# Patient Record
Sex: Male | Born: 1975 | Race: White | Hispanic: No | Marital: Married | Smoking: Former smoker
Health system: Southern US, Community
[De-identification: ages and names within clinical notes are randomized; demographics above are authoritative.]

## PROBLEM LIST (undated history)

## (undated) DIAGNOSIS — M109 Gout, unspecified: Secondary | ICD-10-CM

## (undated) DIAGNOSIS — Z8739 Personal history of other diseases of the musculoskeletal system and connective tissue: Secondary | ICD-10-CM

## (undated) DIAGNOSIS — E669 Obesity, unspecified: Secondary | ICD-10-CM

## (undated) DIAGNOSIS — G4730 Sleep apnea, unspecified: Secondary | ICD-10-CM

## (undated) DIAGNOSIS — I10 Essential (primary) hypertension: Secondary | ICD-10-CM

## (undated) DIAGNOSIS — G4733 Obstructive sleep apnea (adult) (pediatric): Secondary | ICD-10-CM

## (undated) DIAGNOSIS — E559 Vitamin D deficiency, unspecified: Secondary | ICD-10-CM

## (undated) DIAGNOSIS — R5383 Other fatigue: Secondary | ICD-10-CM

## (undated) HISTORY — DX: Other fatigue: R53.83

## (undated) HISTORY — PX: APPENDECTOMY: SHX54

## (undated) HISTORY — DX: Essential (primary) hypertension: I10

## (undated) HISTORY — DX: Vitamin D deficiency, unspecified: E55.9

## (undated) HISTORY — DX: Personal history of other diseases of the musculoskeletal system and connective tissue: Z87.39

## (undated) HISTORY — DX: Gout, unspecified: M10.9

## (undated) HISTORY — DX: Obesity, unspecified: E66.9

## (undated) HISTORY — DX: Sleep apnea, unspecified: G47.30

## (undated) HISTORY — DX: Obstructive sleep apnea (adult) (pediatric): G47.33

---

## 2008-10-02 ENCOUNTER — Emergency Department: Payer: Self-pay | Admitting: Emergency Medicine

## 2012-11-11 ENCOUNTER — Ambulatory Visit: Payer: Self-pay | Admitting: Neurology

## 2014-06-04 ENCOUNTER — Emergency Department: Payer: Self-pay | Admitting: Emergency Medicine

## 2014-06-08 ENCOUNTER — Emergency Department: Payer: Self-pay | Admitting: Emergency Medicine

## 2014-06-08 ENCOUNTER — Other Ambulatory Visit: Payer: Self-pay | Admitting: Family Medicine

## 2014-10-01 ENCOUNTER — Telehealth: Payer: Self-pay | Admitting: Family Medicine

## 2014-10-01 NOTE — Telephone Encounter (Signed)
I have just printed off information from the CDC on hepatitis C (by Barnett Applebaum) If he has had a possible exposure, refer him to the health depatment immediately

## 2014-10-01 NOTE — Telephone Encounter (Signed)
Patient notified to pick up handout, he was not exposed. 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

## 2015-02-07 ENCOUNTER — Encounter: Payer: Self-pay | Admitting: Family Medicine

## 2015-02-07 ENCOUNTER — Ambulatory Visit (INDEPENDENT_AMBULATORY_CARE_PROVIDER_SITE_OTHER): Payer: Managed Care, Other (non HMO) | Admitting: Family Medicine

## 2015-02-07 VITALS — BP 135/93 | HR 62 | Temp 98.4°F | Ht 77.8 in | Wt 360.0 lb

## 2015-02-07 DIAGNOSIS — Z566 Other physical and mental strain related to work: Secondary | ICD-10-CM | POA: Diagnosis not present

## 2015-02-07 DIAGNOSIS — R030 Elevated blood-pressure reading, without diagnosis of hypertension: Secondary | ICD-10-CM

## 2015-02-07 DIAGNOSIS — M79672 Pain in left foot: Secondary | ICD-10-CM | POA: Diagnosis not present

## 2015-02-07 DIAGNOSIS — Z0000 Encounter for general adult medical examination without abnormal findings: Secondary | ICD-10-CM | POA: Diagnosis not present

## 2015-02-07 DIAGNOSIS — IMO0001 Reserved for inherently not codable concepts without codable children: Secondary | ICD-10-CM

## 2015-02-07 MED ORDER — SERTRALINE HCL 100 MG PO TABS: 100.0000 mg | ORAL_TABLET | Freq: Every day | ORAL | Status: DC

## 2015-02-07 MED ORDER — SERTRALINE HCL 50 MG PO TABS: 50.0000 mg | ORAL_TABLET | Freq: Every day | ORAL | Status: DC

## 2015-02-07 MED ORDER — COLCHICINE 0.6 MG PO TABS: ORAL_TABLET | ORAL | Status: DC

## 2015-02-07 NOTE — Progress Notes (Signed)
Patient ID: Darren Robinson, male   DOB: 04/12/1975, 39 y.o.   MRN: [id number]   Subjective:   Darren Robinson is a 39 y.o. male here for a complete physical exam  Interim issues since last visit: pain along the 5th MTP laterally; no foreign body; no gout attack; no blisters, no callus; he has not tried; no NSAIDs; he has put pads on it  He did eat something salty, gained weight; taking sertraline and doing well with that; seeing counselor; was drinking too much a month ago and his wife said something to him, so he's stopped that; he cites stress at his work which has gotten significantly better  Interested in a diet; knows he needs to lose 100 pounds  USPSTF grade A and B recommendations Alcohol: cut back Depression:  Depression screen PHQ 2/9 02/07/2015  Decreased Interest 1  Down, Depressed, Hopeless 3  PHQ - 2 Score 4  Altered sleeping 0  Tired, decreased energy 3  Change in appetite 3  Feeling bad or failure about yourself  0  Trouble concentrating 3  Moving slowly or fidgety/restless 0  Suicidal thoughts 0  PHQ-9 Score 13  Difficult doing work/chores Very difficult  stress  Dr. Yves Dill, monitors labs, clomid, testosterone; few months ago had labs done Hypertension: not quite controlled Obesity: above normal, comfort eater Tobacco use: dipping HIV, hep B, hep C: already tested STD testing and prevention (chl/gon/syphilis): declined Lipids: fasting since b'fast Glucose: fasting Colorectal cancer: no one early in fam Breast cancer: no fam hx, no lumps Lung cancer: n/a Osteoporosis: n/a AAA: n/a Aspirin: n/a Diet: typical fat and fried, tons of meat Exercise: no time, he tries, ran yesterday brief period, 50-50 Skin cancer: no tanning beds; went to health fair; did scan, dry nose  Past Medical History  Diagnosis Date  . Gout   . Hypertension   . Vitamin D deficiency disease   . OSA (obstructive sleep apnea)   . Sleep apnea   . Fatigue    Past Surgical History   Procedure Laterality Date  . Appendectomy     Family History  Problem Relation Age of Onset  . Alcohol abuse Mother   . Arthritis Mother   . Heart disease Mother   . Mental illness Mother   . Thyroid disease Mother   . Diabetes Father   . Heart attack Father   . Kidney disease Father     On Dialysis  . Heart disease Father   . Hyperlipidemia Father   . Thyroid disease Sister   . Hyperlipidemia Sister   . Hypertension Sister   . Lung disease Maternal Grandfather   . Cancer Neg Hx   . Stroke Neg Hx    Social History  Substance Use Topics  . Smoking status: Former Smoker -- 1.00 packs/day for 10 years    Types: Cigarettes    Quit date: 04/06/2004  . Smokeless tobacco: Current User    Types: Chew, Snuff  . Alcohol Use: Yes     Comment: Social   Review of Systems  Constitutional: Positive for unexpected weight change.  HENT: Negative for dental problem and hearing loss.   Eyes: Positive for visual disturbance (blurry vision, later in the day, fine when first waking up; on computer, no dry eyes; had Lasik done; no eyelid heaviness).  Respiratory: Negative for shortness of breath.   Cardiovascular: Negative for chest pain.  Gastrointestinal: Negative for blood in stool.  Genitourinary: Negative for hematuria, decreased urine volume, scrotal swelling, difficulty  urinating and testicular pain.  Skin:       No worrisome moles  Allergic/Immunologic: Negative for food allergies.   Objective:   Filed Vitals:   02/07/15 1358  BP: 135/93  Pulse: 62  Temp: 98.4 F (36.9 C)  Height: 6' 5.8" (1.976 m)  Weight: 360 lb (163.295 kg)  SpO2: 96%   Body mass index is 41.82 kg/(m^2). Wt Readings from Last 3 Encounters:  02/07/15 360 lb (163.295 kg)  07/23/14 358 lb (162.388 kg)   Physical Exam  Constitutional: He appears well-developed and well-nourished. No distress.  Morbidly obese  HENT:  Head: Normocephalic and atraumatic.  Nose: Nose normal.  Mouth/Throat: Oropharynx  is clear and moist.  Eyes: EOM are normal. No scleral icterus.  Neck: No JVD present. No thyromegaly present.  Cardiovascular: Normal rate, regular rhythm and normal heart sounds.   Pulmonary/Chest: Effort normal and breath sounds normal. No respiratory distress. He has no wheezes. He has no rales.  Abdominal: Soft. Bowel sounds are normal. He exhibits no distension. There is no tenderness. There is no guarding.  Musculoskeletal: Normal range of motion. He exhibits no edema.       Left foot: There is tenderness (along lateral aspect head of 5th MTP). There is no swelling and no deformity.  Calluses along base of left foot under ball of foot; no evidence of foreign body; foot is warm, well-perfused  Lymphadenopathy:    He has no cervical adenopathy.  Neurological: He is alert. He displays normal reflexes. He exhibits normal muscle tone. Coordination normal.  Skin: Skin is warm and dry. No rash noted. He is not diaphoretic. No erythema. No pallor.  Psychiatric: He has a normal mood and affect. His behavior is normal. Judgment and thought content normal. His mood appears not anxious. His affect is not angry. He is not agitated, not aggressive and not hyperactive. He does not exhibit a depressed mood.  Very good eye contact with examiner; neatly dressed, business attire    Assessment/Plan:   Problem List Items Addressed This Visit      Other   Preventative health care - Primary    USPSTF grade A and B recommendations reviewed with patient; age-appropriate recommendations, preventive care, screening tests, etc discussed and encouraged; healthy living encouraged; see AVS for patient education given to patient      Relevant Orders   CBC with Differential/Platelet (Completed)   Comprehensive metabolic panel (Completed)   Lipid Panel w/o Chol/HDL Ratio (Completed)   TSH (Completed)   Morbid obesity ([place])    Counseling given on healthier eating, reducing total daily calories, staying hydrated,  etc.; refer to nutritionist; see AVS for information; consider calorie count as a tool; return in 4 weeks      Relevant Orders   Amb ref to Medical Nutrition Therapy-MNT   Work-related stress    Continue counseling; glad he has realized he was using alcohol in excess and that he has stopped that now; will increase the sertraline and see him back for follow-up in 4 weeks      Elevated blood pressure    Explained pressure is above target; weight loss important; DASH guidelines recommended; stress reduction; return in 4 weeks for recheck      Foot pain, left    Not typical for gout; may be stress fracture with his morbid obesity and size; will have him try conservative measures first; consider xray if not improving         Orders Placed This Encounter  Procedures  . CBC with Differential/Platelet  . Comprehensive metabolic panel    Order Specific Question:  Has the patient fasted?    Answer:  Yes  . Lipid Panel w/o Chol/HDL Ratio    Order Specific Question:  Has the patient fasted?    Answer:  Yes  . TSH  . Amb ref to Medical Nutrition Therapy-MNT    Referral Priority:  Routine    Referral Type:  Consultation    Referral Reason:  Specialty Services Required    Requested Specialty:  Nutrition    Number of Visits Requested:  1    Follow up plan: Return in about 4 weeks (around 03/07/2015) for blood pressure and medicine change; 1 year for next physical.  An after-visit summary was printed and given to the patient at [facility].  Please see the patient instructions which may contain other information and recommendations beyond what is mentioned above in the assessment and plan.  Refer nutrition: any day any time  Meds ordered this encounter  Medications  . DISCONTD: sertraline (ZOLOFT) 50 MG tablet    Sig: Take 1 tablet (50 mg total) by mouth daily.    Dispense:  90 tablet    Refill:  3  . colchicine 0.6 MG tablet    Sig: Two pills by mouth at onset of gout flare and then  one pill one hour later    Dispense:  3 tablet    Refill:  6  . sertraline (ZOLOFT) 100 MG tablet    Sig: Take 1 tablet (100 mg total) by mouth daily.    Dispense:  30 tablet    Refill:  3    Please ignore the other 50 mg strength, we're upping it

## 2015-02-07 NOTE — Patient Instructions (Addendum)
Topical ice 20 minutes at a time 3-4 times a day, thin cloth between ice and skin Try turmeric as a natural anti-inflammatory (for pain and arthritis). It comes in capsules where you buy aspirin and fish oil, but also as a spice where you buy pepper and garlic powder. If you need something for aches or pains, try to use Tylenol (acetaminphen) instead of non-steroidals (which include Aleve, ibuprofen, Advil, Motrin, and naproxen); non-steroidals can cause long-term kidney damage  Consider xray in the future if not getting better  Consider getting computer screen glasses (+0.75 strength) and see an eye doctor  Check out the information at familydoctor.org entitled "What It Takes to Lose Weight" Try to lose between 1-2 pounds per week by taking in fewer calories and burning off more calories You can succeed by limiting portions, limiting foods dense in calories and fat, becoming more active, and drinking 8 glasses of water a day (64 ounces) Don't skip meals, especially breakfast, as skipping meals may alter your metabolism Do not use over-the-counter weight loss pills or gimmicks that claim rapid weight loss A healthy BMI (or body mass index) is between 18.5 and 24.9 You can calculate your ideal BMI at the [place] website ClubMonetize.fr  myfitnesspal  I do recommend yearly flu shots; for individuals who don't want flu shots, try to practice excellent hand hygiene, and avoid nursing homes, day cares, and hospitals during peak flu season; taking vitamin C daily during flu/cold season may help boost your immune system too  Let's have you come back to see me in 4 weeks for recheck blood pressure and recheck after the change in the sertraline  Try to follow the DASH guidelines to help lower your blood pressure naturally  You might also try meditation and mindfulness, relaxation techniques to help deal with stress Steps to Elicit the Relaxation  Response The following is the technique reprinted with permission from Dr. Billie Ruddy book The Relaxation Response pages 162-163 1. Sit quietly in a comfortable position. 2. Close your eyes. 3. Deeply relax all your muscles,  beginning at your feet and progressing up to your face.  Keep them relaxed. 4. Breathe through your nose.  Become aware of your breathing.  As you breathe out, say the word, "one"*,  silently to yourself. For example,  breathe in ... out, "one",- in .. out, "one", etc.  Breathe easily and naturally. 5. Continue for 10 to 20 minutes.  You may open your eyes to check the time, but do not use an alarm.  When you finish, sit quietly for several minutes,  at first with your eyes closed and later with your eyes opened.  Do not stand up for a few minutes. 6. Do not worry about whether you are successful  in achieving a deep level of relaxation.  Maintain a passive attitude and permit relaxation to occur at its own pace.  When distracting thoughts occur,  try to ignore them by not dwelling upon them  and return to repeating "one."  With practice, the response should come with little effort.  Practice the technique once or twice daily,  but not within two hours after any meal,  since the digestive processes seem to interfere with  the elicitation of the Relaxation Response. * It is better to use a soothing, mellifluous sound, preferably with no meaning. or association, to avoid stimulation of unnecessary thoughts - a mantra.    Health Maintenance, Male A healthy lifestyle and preventative care can promote health and  wellness.  Maintain regular health, dental, and eye exams.  Eat a healthy diet. Foods like vegetables, fruits, whole grains, low-fat dairy products, and lean protein foods contain the nutrients you need and are low in calories. Decrease your intake of foods high in solid fats, added sugars, and salt. Get information about a proper diet from your  health care provider, if necessary.  Regular physical exercise is one of the most important things you can do for your health. Most adults should get at least 150 minutes of moderate-intensity exercise (any activity that increases your heart rate and causes you to sweat) each week. In addition, most adults need muscle-strengthening exercises on 2 or more days a week.   Maintain a healthy weight. The body mass index (BMI) is a screening tool to identify possible weight problems. It provides an estimate of body fat based on height and weight. Your health care provider can find your BMI and can help you achieve or maintain a healthy weight. For males 20 years and older:  A BMI below 18.5 is considered underweight.  A BMI of 18.5 to 24.9 is normal.  A BMI of 25 to 29.9 is considered overweight.  A BMI of 30 and above is considered obese.  Maintain normal blood lipids and cholesterol by exercising and minimizing your intake of saturated fat. Eat a balanced diet with plenty of fruits and vegetables. Blood tests for lipids and cholesterol should begin at age 43 and be repeated every 5 years. If your lipid or cholesterol levels are high, you are over age 87, or you are at high risk for heart disease, you may need your cholesterol levels checked more frequently.Ongoing high lipid and cholesterol levels should be treated with medicines if diet and exercise are not working.  If you smoke, find out from your health care provider how to quit. If you do not use tobacco, do not start.  Lung cancer screening is recommended for adults aged 9-80 years who are at high risk for developing lung cancer because of a history of smoking. A yearly low-dose CT scan of the lungs is recommended for people who have at least a 30-pack-year history of smoking and are current smokers or have quit within the past 15 years. A pack year of smoking is smoking an average of 1 pack of cigarettes a day for 1 year (for example, a  30-pack-year history of smoking could mean smoking 1 pack a day for 30 years or 2 packs a day for 15 years). Yearly screening should continue until the smoker has stopped smoking for at least 15 years. Yearly screening should be stopped for people who develop a health problem that would prevent them from having lung cancer treatment.  If you choose to drink alcohol, do not have more than 2 drinks per day. One drink is considered to be 12 oz (360 mL) of beer, 5 oz (150 mL) of wine, or 1.5 oz (45 mL) of liquor.  Avoid the use of street drugs. Do not share needles with anyone. Ask for help if you need support or instructions about stopping the use of drugs.  High blood pressure causes heart disease and increases the risk of stroke. High blood pressure is more likely to develop in:  People who have blood pressure in the end of the normal range (100-139/85-89 mm Hg).  People who are overweight or obese.  People who are African American.  If you are 61-62 years of age, have your blood pressure checked  every 3-5 years. If you are 56 years of age or older, have your blood pressure checked every year. You should have your blood pressure measured twice--once when you are at a hospital or clinic, and once when you are not at a hospital or clinic. Record the average of the two measurements. To check your blood pressure when you are not at a hospital or clinic, you can use:  An automated blood pressure machine at a pharmacy.  A home blood pressure monitor.  If you are 64-10 years old, ask your health care provider if you should take aspirin to prevent heart disease.  Diabetes screening involves taking a blood sample to check your fasting blood sugar level. This should be done once every 3 years after age 36 if you are at a normal weight and without risk factors for diabetes. Testing should be considered at a younger age or be carried out more frequently if you are overweight and have at least 1 risk factor  for diabetes.  Colorectal cancer can be detected and often prevented. Most routine colorectal cancer screening begins at the age of 70 and continues through age 6. However, your health care provider may recommend screening at an earlier age if you have risk factors for colon cancer. On a yearly basis, your health care provider may provide home test kits to check for hidden blood in the stool. A small camera at the end of a tube may be used to directly examine the colon (sigmoidoscopy or colonoscopy) to detect the earliest forms of colorectal cancer. Talk to your health care provider about this at age 57 when routine screening begins. A direct exam of the colon should be repeated every 5-10 years through age 71, unless early forms of precancerous polyps or small growths are found.  People who are at an increased risk for hepatitis B should be screened for this virus. You are considered at high risk for hepatitis B if:  You were born in a country where hepatitis B occurs often. Talk with your health care provider about which countries are considered high risk.  Your parents were born in a high-risk country and you have not received a shot to protect against hepatitis B (hepatitis B vaccine).  You have HIV or AIDS.  You use needles to inject street drugs.  You live with, or have sex with, someone who has hepatitis B.  You are a man who has sex with other men (MSM).  You get hemodialysis treatment.  You take certain medicines for conditions like cancer, organ transplantation, and autoimmune conditions.  Hepatitis C blood testing is recommended for all people born from [birth year] through 1964 and any individual with known risk factors for hepatitis C.  Healthy men should no longer receive prostate-specific antigen (PSA) blood tests as part of routine cancer screening. Talk to your health care provider about prostate cancer screening.  Testicular cancer screening is not recommended for adolescents or  adult males who have no symptoms. Screening includes self-exam, a health care provider exam, and other screening tests. Consult with your health care provider about any symptoms you have or any concerns you have about testicular cancer.  Practice safe sex. Use condoms and avoid high-risk sexual practices to reduce the spread of sexually transmitted infections (STIs).  You should be screened for STIs, including gonorrhea and chlamydia if:  You are sexually active and are younger than 24 years.  You are older than 24 years, and your health care provider tells you  that you are at risk for this type of infection.  Your sexual activity has changed since you were last screened, and you are at an increased risk for chlamydia or gonorrhea. Ask your health care provider if you are at risk.  If you are at risk of being infected with HIV, it is recommended that you take a prescription medicine daily to prevent HIV infection. This is called pre-exposure prophylaxis (PrEP). You are considered at risk if:  You are a man who has sex with other men (MSM).  You are a heterosexual man who is sexually active with multiple partners.  You take drugs by injection.  You are sexually active with a partner who has HIV.  Talk with your health care provider about whether you are at high risk of being infected with HIV. If you choose to begin PrEP, you should first be tested for HIV. You should then be tested every 3 months for as long as you are taking PrEP.  Use sunscreen. Apply sunscreen liberally and repeatedly throughout the day. You should seek shade when your shadow is shorter than you. Protect yourself by wearing long sleeves, pants, a wide-brimmed hat, and sunglasses year round whenever you are outdoors.  Tell your health care provider of new moles or changes in moles, especially if there is a change in shape or color. Also, tell your health care provider if a mole is larger than the size of a pencil  eraser.  A one-time screening for abdominal aortic aneurysm (AAA) and surgical repair of large AAAs by ultrasound is recommended for men aged 51-75 years who are current or former smokers.  Stay current with your vaccines (immunizations).   This information is not intended to replace advice given to you by your health care provider. Make sure you discuss any questions you have with your health care provider.   Document Released: 09/19/2007 Document Revised: 04/13/2014 Document Reviewed: 08/18/2010 Elsevier Interactive Patient Education 2016 [place] DASH stands for "Dietary Approaches to Stop Hypertension." The DASH eating plan is a healthy eating plan that has been shown to reduce high blood pressure (hypertension). Additional health benefits may include reducing the risk of type 2 diabetes mellitus, heart disease, and stroke. The DASH eating plan may also help with weight loss. WHAT DO I NEED TO KNOW ABOUT THE DASH EATING PLAN? For the DASH eating plan, you will follow these general guidelines:  Choose foods with a percent daily value for sodium of less than 5% (as listed on the food label).  Use salt-free seasonings or herbs instead of table salt or sea salt.  Check with your health care provider or pharmacist before using salt substitutes.  Eat lower-sodium products, often labeled as "lower sodium" or "no salt added."  Eat fresh foods.  Eat more vegetables, fruits, and low-fat dairy products.  Choose whole grains. Look for the word "whole" as the first word in the ingredient list.  Choose fish and skinless chicken or [place] more often than red meat. Limit fish, poultry, and meat to 6 oz (170 g) each day.  Limit sweets, desserts, sugars, and sugary drinks.  Choose heart-healthy fats.  Limit cheese to 1 oz (28 g) per day.  Eat more home-cooked food and less restaurant, buffet, and fast food.  Limit fried foods.  Cook foods using methods other than  frying.  Limit canned vegetables. If you do use them, rinse them well to decrease the sodium.  When eating at a restaurant,  ask that your food be prepared with less salt, or no salt if possible. WHAT FOODS CAN I EAT? Seek help from a dietitian for individual calorie needs. Grains Whole grain or whole wheat bread. Brown rice. Whole grain or whole wheat pasta. Quinoa, bulgur, and whole grain cereals. Low-sodium cereals. Corn or whole wheat flour tortillas. Whole grain cornbread. Whole grain crackers. Low-sodium crackers. Vegetables Fresh or frozen vegetables (raw, steamed, roasted, or grilled). Low-sodium or reduced-sodium tomato and vegetable juices. Low-sodium or reduced-sodium tomato sauce and paste. Low-sodium or reduced-sodium canned vegetables.  Fruits All fresh, canned (in natural juice), or frozen fruits. Meat and Other Protein Products Ground beef (85% or leaner), grass-fed beef, or beef trimmed of fat. Skinless chicken or [place]. Ground chicken or [place]. Pork trimmed of fat. All fish and seafood. Eggs. Dried beans, peas, or lentils. Unsalted nuts and seeds. Unsalted canned beans. Dairy Low-fat dairy products, such as skim or 1% milk, 2% or reduced-fat cheeses, low-fat ricotta or cottage cheese, or plain low-fat yogurt. Low-sodium or reduced-sodium cheeses. Fats and Oils Tub margarines without trans fats. Light or reduced-fat mayonnaise and salad dressings (reduced sodium). Avocado. Safflower, olive, or canola oils. Natural peanut or almond butter. Other Unsalted popcorn and pretzels. The items listed above may not be a complete list of recommended foods or beverages. Contact your dietitian for more options. WHAT FOODS ARE NOT RECOMMENDED? Grains White bread. White pasta. White rice. Refined cornbread. Bagels and croissants. Crackers that contain trans fat. Vegetables Creamed or fried vegetables. Vegetables in a cheese sauce. Regular canned vegetables. Regular canned tomato sauce  and paste. Regular tomato and vegetable juices. Fruits Dried fruits. Canned fruit in light or heavy syrup. Fruit juice. Meat and Other Protein Products Fatty cuts of meat. Ribs, chicken wings, bacon, sausage, bologna, salami, chitterlings, fatback, hot dogs, bratwurst, and packaged luncheon meats. Salted nuts and seeds. Canned beans with salt. Dairy Whole or 2% milk, cream, half-and-half, and cream cheese. Whole-fat or sweetened yogurt. Full-fat cheeses or blue cheese. Nondairy creamers and whipped toppings. Processed cheese, cheese spreads, or cheese curds. Condiments Onion and garlic salt, seasoned salt, table salt, and sea salt. Canned and packaged gravies. Worcestershire sauce. Tartar sauce. Barbecue sauce. Teriyaki sauce. Soy sauce, including reduced sodium. Steak sauce. Fish sauce. Oyster sauce. Cocktail sauce. Horseradish. Ketchup and mustard. Meat flavorings and tenderizers. Bouillon cubes. Hot sauce. Tabasco sauce. Marinades. Taco seasonings. Relishes. Fats and Oils Butter, stick margarine, lard, shortening, ghee, and bacon fat. Coconut, palm kernel, or palm oils. Regular salad dressings. Other Pickles and olives. Salted popcorn and pretzels. The items listed above may not be a complete list of foods and beverages to avoid. Contact your dietitian for more information. WHERE CAN I FIND MORE INFORMATION? National Heart, Lung, and Blood Institute: travelstabloid.com   This information is not intended to replace advice given to you by your health care provider. Make sure you discuss any questions you have with your health care provider.   Document Released: 03/12/2011 Document Revised: 04/13/2014 Document Reviewed: 01/25/2013 Elsevier Interactive Patient Education [employer].

## 2015-02-08 LAB — COMPREHENSIVE METABOLIC PANEL
ALBUMIN: 4.6 g/dL (ref 3.5–5.5)
ALT: 30 IU/L (ref 0–44)
AST: 25 IU/L (ref 0–40)
Albumin/Globulin Ratio: 1.9 (ref 1.1–2.5)
Alkaline Phosphatase: 54 IU/L (ref 39–117)
BILIRUBIN TOTAL: 0.4 mg/dL (ref 0.0–1.2)
BUN / CREAT RATIO: 9 (ref 8–19)
BUN: 12 mg/dL (ref 6–20)
CHLORIDE: 105 mmol/L (ref 97–106)
CO2: 24 mmol/L (ref 18–29)
Calcium: 9.3 mg/dL (ref 8.7–10.2)
Creatinine, Ser: 1.34 mg/dL — ABNORMAL HIGH (ref 0.76–1.27)
GFR calc non Af Amer: 67 mL/min/{1.73_m2} (ref >59)
GFR, EST AFRICAN AMERICAN: 77 mL/min/{1.73_m2} (ref >59)
Globulin, Total: 2.4 g/dL (ref 1.5–4.5)
Glucose: 83 mg/dL (ref 65–99)
POTASSIUM: 4.6 mmol/L (ref 3.5–5.2)
Sodium: 143 mmol/L (ref 136–144)
TOTAL PROTEIN: 7 g/dL (ref 6.0–8.5)

## 2015-02-08 LAB — CBC WITH DIFFERENTIAL/PLATELET
BASOS: 1 %
Basophils Absolute: 0.1 10*3/uL (ref 0.0–0.2)
EOS (ABSOLUTE): 0.3 10*3/uL (ref 0.0–0.4)
Eos: 4 %
HEMOGLOBIN: 15.5 g/dL (ref 12.6–17.7)
Hematocrit: 44.7 % (ref 37.5–51.0)
IMMATURE GRANS (ABS): 0 10*3/uL (ref 0.0–0.1)
Immature Granulocytes: 0 %
LYMPHS ABS: 2.9 10*3/uL (ref 0.7–3.1)
LYMPHS: 38 %
MCH: 31.1 pg (ref 26.6–33.0)
MCHC: 34.7 g/dL (ref 31.5–35.7)
MCV: 90 fL (ref 79–97)
Monocytes Absolute: 0.7 10*3/uL (ref 0.1–0.9)
Monocytes: 8 %
NEUTROS ABS: 3.8 10*3/uL (ref 1.4–7.0)
Neutrophils: 49 %
PLATELETS: 171 10*3/uL (ref 150–379)
RBC: 4.99 x10E6/uL (ref 4.14–5.80)
RDW: 14.1 % (ref 12.3–15.4)
WBC: 7.7 10*3/uL (ref 3.4–10.8)

## 2015-02-08 LAB — TSH: TSH: 3 u[IU]/mL (ref 0.450–4.500)

## 2015-02-08 LAB — LIPID PANEL W/O CHOL/HDL RATIO
Cholesterol, Total: 210 mg/dL — ABNORMAL HIGH (ref 100–199)
HDL: 42 mg/dL (ref >39)
LDL Calculated: 125 mg/dL — ABNORMAL HIGH (ref 0–99)
Triglycerides: 216 mg/dL — ABNORMAL HIGH (ref 0–149)
VLDL Cholesterol Cal: 43 mg/dL — ABNORMAL HIGH (ref 5–40)

## 2015-02-10 ENCOUNTER — Encounter: Payer: Self-pay | Admitting: Family Medicine

## 2015-02-11 DIAGNOSIS — Z566 Other physical and mental strain related to work: Secondary | ICD-10-CM | POA: Insufficient documentation

## 2015-02-11 DIAGNOSIS — I10 Essential (primary) hypertension: Secondary | ICD-10-CM

## 2015-02-11 DIAGNOSIS — M79672 Pain in left foot: Secondary | ICD-10-CM | POA: Insufficient documentation

## 2015-02-11 DIAGNOSIS — E669 Obesity, unspecified: Secondary | ICD-10-CM

## 2015-02-11 HISTORY — DX: Essential (primary) hypertension: I10

## 2015-02-11 HISTORY — DX: Obesity, unspecified: E66.9

## 2015-02-11 NOTE — Assessment & Plan Note (Signed)
Explained pressure is above target; weight loss important; DASH guidelines recommended; stress reduction; return in 4 weeks for recheck

## 2015-02-11 NOTE — Assessment & Plan Note (Signed)
Continue counseling; glad he has realized he was using alcohol in excess and that he has stopped that now; will increase the sertraline and see him back for follow-up in 4 weeks

## 2015-02-11 NOTE — Assessment & Plan Note (Signed)
USPSTF grade A and B recommendations reviewed with patient; age-appropriate recommendations, preventive care, screening tests, etc discussed and encouraged; healthy living encouraged; see AVS for patient education given to patient  

## 2015-02-11 NOTE — Assessment & Plan Note (Signed)
Counseling given on healthier eating, reducing total daily calories, staying hydrated, etc.; refer to nutritionist; see AVS for information; consider calorie count as a tool; return in 4 weeks

## 2015-02-11 NOTE — Assessment & Plan Note (Signed)
Not typical for gout; may be stress fracture with his morbid obesity and size; will have him try conservative measures first; consider xray if not improving

## 2015-02-21 ENCOUNTER — Encounter: Payer: Managed Care, Other (non HMO) | Attending: Family Medicine | Admitting: Dietician

## 2015-02-21 ENCOUNTER — Encounter: Payer: Self-pay | Admitting: Dietician

## 2015-02-21 DIAGNOSIS — E669 Obesity, unspecified: Secondary | ICD-10-CM | POA: Insufficient documentation

## 2015-02-21 NOTE — Progress Notes (Signed)
Medical Nutrition Therapy: Visit start time: 0900  end time: 1000  Assessment:  Diagnosis: obesity Past medical history: gout, GERD, sleep apnea Psychosocial issues/ stress concerns: depression, high stress level due to work Preferred learning method:  . Hands-on   Current weight: 369.5lbs with shoes, suit  Height: 6'7" Medications, supplements: reviewed list in chart with patient Progress and evaluation: Patient reports busy schedule at work and sometimes long hours at work, to the point of not having time to eat much during the day.           He states he is very hungry by the time he gets home from work, and then eats large meal with sweets afterwards.          He reports some stress eating as well.          He has tried some weight loss plans which lasted only a few days.            Physical activity: some on the job activity, no structured exercise.  Dietary Intake:  Usual eating pattern includes 1-3 meals and 1 snacks per day. Dining out frequency: 9 meals per week.  Breakfast: sausage 2, 2 eggs, grits; occasionally pancakes; sometimes misses due to time.  Snack: none Lunch: fast food, occasionally leftovers, often no lunch due to work.  Snack: none Supper: wife cooks: spaghetti, chicken pie, pulled pork mac and cheese.  Snack: cookies, ice cream Beverages: water at work; mt dew, pepsi, beer  Nutrition Care Education: Topics covered: weight management Basic nutrition: basic food groups, appropriate nutrient balance, appropriate meal and snack schedule, general nutrition guidelines    Weight control: 2200kcal meal plan with 50%CHO, 20% protein, 30% fat; importance of eating at regular intervals, options for quick meals and snacks,   Importance of low sugar and low fat foods and beverages, behavioral changes for weight loss such as eating slowly and managing stress prior to eating.  Advanced nutrition: dining out Other lifestyle changes: identifying habits that need to change,  stress management  Nutritional Diagnosis:  [place]-3.3 Overweight/obesity As related to excess caloric intake, stress, low activity.  As evidenced by patient report, high BMI.  Intervention: Instruction as noted above.   Did not address exercise and food tracking at this time due to patient's busy schedule.    Set goals with patient input.    Patient declined scheduling follow-up at this time; will plan to call patient to check on progress in 1 month.      Education Materials given:  . Food lists/ Planning A Balanced Meal . Sample meal pattern/ menus; Quick and Healthy Meal Ideas . Snacking handout . Goals/ instructions  Learner/ who was taught:  . Patient  . Spouse/ partner   Level of understanding: Marland Kitchen Verbalizes/ demonstrates competency  Demonstrated degree of understanding via:   Teach back Learning barriers: . None   Willingness to learn/ readiness for change: . Acceptance, ready for change   Monitoring and Evaluation:  Dietary intake, exercise, and body weight      follow up: by phone in about 1 month

## 2015-02-21 NOTE — Patient Instructions (Signed)
   Try having a protein or nutrition drink such as Premier Protein, muscle milk, Glucerna during the day if unable to eat lunch.   Carry simple sandwich or snacks with you to eat during the day.   Choose grilled or baked meats at restaurants and include a fruit or vegetable when able.   Eat slowly in the evenings, chew food thoroughly, and work to decrease food portions especially of starchy foods.   Keep regular sodas to 1 glass daily or less.

## 2015-04-22 ENCOUNTER — Telehealth: Payer: Self-pay | Admitting: Dietician

## 2015-04-22 NOTE — Telephone Encounter (Signed)
Called patient to check on progress. He reports being unable to implement changes to improve nutrition during the day, his work prevents him from being able to eat much.  He feels he has not lost weight.  Encouraged him to continue working on portion control, lowfat choices, and sugar-free beverages.  Encouraged him to schedule follow-up if he would like to regroup and continue working on weight loss.

## 2015-07-26 ENCOUNTER — Other Ambulatory Visit: Payer: Self-pay | Admitting: Family Medicine

## 2015-07-26 DIAGNOSIS — R7989 Other specified abnormal findings of blood chemistry: Secondary | ICD-10-CM

## 2015-07-26 NOTE — Telephone Encounter (Signed)
Pt is going to stay with you and has scheduled an appt for may 1, can he go ahead and get refill?

## 2015-07-26 NOTE — Telephone Encounter (Signed)
Yes, absolutely; Rx sent; we look forward to seeing him

## 2015-07-26 NOTE — Telephone Encounter (Signed)
Patient does not have any upcoming appointments We had hoped to see him 4 weeks after last visit for BP and weight; I believe I was out sick, but I don't see that his visit was rescheduled Please find out if he's staying at [facility] or moving to [place], schedule f/u appt, and back to me if I'm seeing him, or forward request to [facility] if they're seeing him Thank you

## 2015-08-05 ENCOUNTER — Ambulatory Visit (INDEPENDENT_AMBULATORY_CARE_PROVIDER_SITE_OTHER): Payer: Managed Care, Other (non HMO) | Admitting: Family Medicine

## 2015-08-05 ENCOUNTER — Encounter: Payer: Self-pay | Admitting: Family Medicine

## 2015-08-05 VITALS — BP 134/90 | HR 81 | Temp 98.0°F | Resp 16 | Ht 79.0 in | Wt 350.0 lb

## 2015-08-05 DIAGNOSIS — E669 Obesity, unspecified: Secondary | ICD-10-CM | POA: Diagnosis not present

## 2015-08-05 DIAGNOSIS — Z566 Other physical and mental strain related to work: Secondary | ICD-10-CM | POA: Diagnosis not present

## 2015-08-05 DIAGNOSIS — Z8639 Personal history of other endocrine, nutritional and metabolic disease: Secondary | ICD-10-CM | POA: Diagnosis not present

## 2015-08-05 DIAGNOSIS — R7989 Other specified abnormal findings of blood chemistry: Secondary | ICD-10-CM

## 2015-08-05 DIAGNOSIS — R748 Abnormal levels of other serum enzymes: Secondary | ICD-10-CM | POA: Diagnosis not present

## 2015-08-05 DIAGNOSIS — E785 Hyperlipidemia, unspecified: Secondary | ICD-10-CM | POA: Insufficient documentation

## 2015-08-05 DIAGNOSIS — I10 Essential (primary) hypertension: Secondary | ICD-10-CM

## 2015-08-05 DIAGNOSIS — Z8739 Personal history of other diseases of the musculoskeletal system and connective tissue: Secondary | ICD-10-CM

## 2015-08-05 HISTORY — DX: Personal history of other diseases of the musculoskeletal system and connective tissue: Z87.39

## 2015-08-05 MED ORDER — AMLODIPINE BESYLATE 5 MG PO TABS: 5.0000 mg | ORAL_TABLET | Freq: Every day | ORAL | Status: DC

## 2015-08-05 NOTE — Assessment & Plan Note (Signed)
Reviewed lipid panel from Nov 2016; recheck next week fasting

## 2015-08-05 NOTE — Patient Instructions (Addendum)
Start the new blood pressure medicine  Your goal blood pressure is less than 140 mmHg on top, and less than 90 on bottom Try to follow the DASH guidelines (DASH stands for Dietary Approaches to Stop Hypertension) Try to limit the sodium in your diet.  Ideally, consume less than 1.5 grams (less than 1,500mg ) per day. Do not add salt when cooking or at the table.  Check the sodium amount on labels when shopping, and choose items lower in sodium when given a choice. Avoid or limit foods that already contain a lot of sodium. Eat a diet rich in fruits and vegetables and whole grains.   Steps to Elicit the Relaxation Response The following is the technique reprinted with permission from Dr. Billie Ruddy book The Relaxation Response pages 162-163 1. Sit quietly in a comfortable position. 2. Close your eyes. 3. Deeply relax all your muscles,  beginning at your feet and progressing up to your face.  Keep them relaxed. 4. Breathe through your nose.  Become aware of your breathing.  As you breathe out, say the word, "one"*,  silently to yourself. For example,  breathe in ... out, "one",- in .. out, "one", etc.  Breathe easily and naturally. 5. Continue for 10 to 20 minutes.  You may open your eyes to check the time, but do not use an alarm.  When you finish, sit quietly for several minutes,  at first with your eyes closed and later with your eyes opened.  Do not stand up for a few minutes. 6. Do not worry about whether you are successful  in achieving a deep level of relaxation.  Maintain a passive attitude and permit relaxation to occur at its own pace.  When distracting thoughts occur,  try to ignore them by not dwelling upon them  and return to repeating "one."  With practice, the response should come with little effort.  Practice the technique once or twice daily,  but not within two hours after any meal,  since the digestive processes seem to interfere with  the elicitation of the  Relaxation Response. * It is better to use a soothing, mellifluous sound, preferably with no meaning. or association, to avoid stimulation of unnecessary thoughts - a mantra. DASH Eating Plan DASH stands for "Dietary Approaches to Stop Hypertension." The DASH eating plan is a healthy eating plan that has been shown to reduce high blood pressure (hypertension). Additional health benefits may include reducing the risk of type 2 diabetes mellitus, heart disease, and stroke. The DASH eating plan may also help with weight loss. WHAT DO I NEED TO KNOW ABOUT THE DASH EATING PLAN? For the DASH eating plan, you will follow these general guidelines:  Choose foods with a percent daily value for sodium of less than 5% (as listed on the food label).  Use salt-free seasonings or herbs instead of table salt or sea salt.  Check with your health care provider or pharmacist before using salt substitutes.  Eat lower-sodium products, often labeled as "lower sodium" or "no salt added."  Eat fresh foods.  Eat more vegetables, fruits, and low-fat dairy products.  Choose whole grains. Look for the word "whole" as the first word in the ingredient list.  Choose fish and skinless chicken or [place] more often than red meat. Limit fish, poultry, and meat to 6 oz (170 g) each day.  Limit sweets, desserts, sugars, and sugary drinks.  Choose heart-healthy fats.  Limit cheese to 1 oz (28 g) per day.  Eat more home-cooked food and less restaurant, buffet, and fast food.  Limit fried foods.  Cook foods using methods other than frying.  Limit canned vegetables. If you do use them, rinse them well to decrease the sodium.  When eating at a restaurant, ask that your food be prepared with less salt, or no salt if possible. WHAT FOODS CAN I EAT? Seek help from a dietitian for individual calorie needs. Grains Whole grain or whole wheat bread. Brown rice. Whole grain or whole wheat pasta. Quinoa, bulgur, and whole  grain cereals. Low-sodium cereals. Corn or whole wheat flour tortillas. Whole grain cornbread. Whole grain crackers. Low-sodium crackers. Vegetables Fresh or frozen vegetables (raw, steamed, roasted, or grilled). Low-sodium or reduced-sodium tomato and vegetable juices. Low-sodium or reduced-sodium tomato sauce and paste. Low-sodium or reduced-sodium canned vegetables.  Fruits All fresh, canned (in natural juice), or frozen fruits. Meat and Other Protein Products Ground beef (85% or leaner), grass-fed beef, or beef trimmed of fat. Skinless chicken or [place]. Ground chicken or [place]. Pork trimmed of fat. All fish and seafood. Eggs. Dried beans, peas, or lentils. Unsalted nuts and seeds. Unsalted canned beans. Dairy Low-fat dairy products, such as skim or 1% milk, 2% or reduced-fat cheeses, low-fat ricotta or cottage cheese, or plain low-fat yogurt. Low-sodium or reduced-sodium cheeses. Fats and Oils Tub margarines without trans fats. Light or reduced-fat mayonnaise and salad dressings (reduced sodium). Avocado. Safflower, olive, or canola oils. Natural peanut or almond butter. Other Unsalted popcorn and pretzels. The items listed above may not be a complete list of recommended foods or beverages. Contact your dietitian for more options. WHAT FOODS ARE NOT RECOMMENDED? Grains White bread. White pasta. White rice. Refined cornbread. Bagels and croissants. Crackers that contain trans fat. Vegetables Creamed or fried vegetables. Vegetables in a cheese sauce. Regular canned vegetables. Regular canned tomato sauce and paste. Regular tomato and vegetable juices. Fruits Dried fruits. Canned fruit in light or heavy syrup. Fruit juice. Meat and Other Protein Products Fatty cuts of meat. Ribs, chicken wings, bacon, sausage, bologna, salami, chitterlings, fatback, hot dogs, bratwurst, and packaged luncheon meats. Salted nuts and seeds. Canned beans with salt. Dairy Whole or 2% milk, cream, half-and-half,  and cream cheese. Whole-fat or sweetened yogurt. Full-fat cheeses or blue cheese. Nondairy creamers and whipped toppings. Processed cheese, cheese spreads, or cheese curds. Condiments Onion and garlic salt, seasoned salt, table salt, and sea salt. Canned and packaged gravies. Worcestershire sauce. Tartar sauce. Barbecue sauce. Teriyaki sauce. Soy sauce, including reduced sodium. Steak sauce. Fish sauce. Oyster sauce. Cocktail sauce. Horseradish. Ketchup and mustard. Meat flavorings and tenderizers. Bouillon cubes. Hot sauce. Tabasco sauce. Marinades. Taco seasonings. Relishes. Fats and Oils Butter, stick margarine, lard, shortening, ghee, and bacon fat. Coconut, palm kernel, or palm oils. Regular salad dressings. Other Pickles and olives. Salted popcorn and pretzels. The items listed above may not be a complete list of foods and beverages to avoid. Contact your dietitian for more information. WHERE CAN I FIND MORE INFORMATION? National Heart, Lung, and Blood Institute: travelstabloid.com   This information is not intended to replace advice given to you by your health care provider. Make sure you discuss any questions you have with your health care provider.   Document Released: 03/12/2011 Document Revised: 04/13/2014 Document Reviewed: 01/25/2013 Elsevier Interactive Patient Education [employer].

## 2015-08-05 NOTE — Assessment & Plan Note (Signed)
Suspect this is related to his muscle mass, large build, more than a marker of actual renal function decline; recheck next week

## 2015-08-05 NOTE — Assessment & Plan Note (Signed)
Glad to hear he is not drinking, no recent flares; will avoid HCTZ for BP; check uric acid with other labs next week

## 2015-08-05 NOTE — Assessment & Plan Note (Addendum)
advised him to STOP the phentermine with his hypertension, risk of stroke, heart attack

## 2015-08-05 NOTE — Assessment & Plan Note (Signed)
Encouraged him to start back with counseling; reviewed relaxation response with him; continue SSRI

## 2015-08-05 NOTE — Assessment & Plan Note (Signed)
Advised him to STOP the phentermine; start antihypertensive; try to follow DASH guidelines; close f/u

## 2015-08-05 NOTE — Progress Notes (Signed)
BP 134/90 mmHg  Pulse 81  Temp(Src) 98 F (36.7 C) (Oral)  Resp 16  Ht 6\' 7"  (2.007 m)  Wt 350 lb (158.759 kg)  BMI 39.41 kg/m2  SpO2 96%   Subjective:    Patient ID: Darren Robinson, male    DOB: 10-Dec-1975, 40 y.o.   MRN: [id number]  HPI: Darren Robinson is a 40 y.o. male  Chief Complaint  Patient presents with  . Follow-up  . Obesity   Patient is here for f/u; his blood pressure is noted to be quite high on check-in; I asked staff to recheck after he sat for several minutes; 134/90; he was at work for a little bit; back in detective division, hot seat for police department; light at the end of the tunnel; he has never been on anything for BP; I asked who else in the family has hypetension; his brother does not; sister does and both parents did have HTN  He started taking diet pills, prescription filled on July 09, 2014, prescribed by Samuella Cota (per [facility]); he has struggled with obesity for years; has lost 30 pounds in last two months  He is not drinking and has not had any gout flares; foot pain is completely resolved  Depression screen [facility] 2/9 08/05/2015 02/21/2015 02/07/2015  Decreased Interest 0 0 1  Down, Depressed, Hopeless 0 3 3  PHQ - 2 Score 0 3 4  Altered sleeping - 3 0  Tired, decreased energy - 3 3  Change in appetite - 3 3  Feeling bad or failure about yourself  - 0 0  Trouble concentrating - 3 3  Moving slowly or fidgety/restless - 0 0  Suicidal thoughts - 0 0  PHQ-9 Score - 15 13  Difficult doing work/chores - Somewhat difficult Very difficult   Relevant past medical, surgical, family and social history reviewed and updated as indicated. Interim medical history since last visit reviewed. Allergies and medications reviewed and updated.  Review of Systems  Respiratory: Negative for shortness of breath.   Cardiovascular: Negative for chest pain.   Per HPI unless specifically indicated above     Objective:    BP 134/90 mmHg  Pulse 81  Temp(Src) 98  F (36.7 C) (Oral)  Resp 16  Ht 6\' 7"  (2.007 m)  Wt 350 lb (158.759 kg)  BMI 39.41 kg/m2  SpO2 96%  Wt Readings from Last 3 Encounters:  08/05/15 350 lb (158.759 kg)  02/21/15 369 lb 8 oz (167.604 kg)  02/07/15 360 lb (163.295 kg)    Physical Exam  Constitutional: He appears well-developed and well-nourished.  Large build, tall, obese, but weight down 19+ pounds since last visit  Cardiovascular: Normal rate and regular rhythm.   Pulmonary/Chest: Effort normal and breath sounds normal.  Skin: No pallor.  Psychiatric: He has a normal mood and affect. His behavior is normal. Judgment and thought content normal.   Results for orders placed or performed in visit on 02/07/15  CBC with Differential/Platelet  Result Value Ref Range   WBC 7.7 3.4 - 10.8 x10E3/uL   RBC 4.99 4.14 - 5.80 x10E6/uL   Hemoglobin 15.5 12.6 - 17.7 g/dL   Hematocrit 44.7 37.5 - 51.0 %   MCV 90 79 - 97 fL   MCH 31.1 26.6 - 33.0 pg   MCHC 34.7 31.5 - 35.7 g/dL   RDW 14.1 12.3 - 15.4 %   Platelets 171 150 - 379 x10E3/uL   Neutrophils 49 %   Lymphs 38 %  Monocytes 8 %   Eos 4 %   Basos 1 %   Neutrophils Absolute 3.8 1.4 - 7.0 x10E3/uL   Lymphocytes Absolute 2.9 0.7 - 3.1 x10E3/uL   Monocytes Absolute 0.7 0.1 - 0.9 x10E3/uL   EOS (ABSOLUTE) 0.3 0.0 - 0.4 x10E3/uL   Basophils Absolute 0.1 0.0 - 0.2 x10E3/uL   Immature Granulocytes 0 %   Immature Grans (Abs) 0.0 0.0 - 0.1 x10E3/uL  Comprehensive metabolic panel  Result Value Ref Range   Glucose 83 65 - 99 mg/dL   BUN 12 6 - 20 mg/dL   Creatinine, Ser 1.34 (H) 0.76 - 1.27 mg/dL   GFR calc non Af Amer 67 >59 mL/min/1.73   GFR calc Af Amer 77 >59 mL/min/1.73   BUN/Creatinine Ratio 9 8 - 19   Sodium 143 136 - 144 mmol/L   Potassium 4.6 3.5 - 5.2 mmol/L   Chloride 105 97 - 106 mmol/L   CO2 24 18 - 29 mmol/L   Calcium 9.3 8.7 - 10.2 mg/dL   Total Protein 7.0 6.0 - 8.5 g/dL   Albumin 4.6 3.5 - 5.5 g/dL   Globulin, Total 2.4 1.5 - 4.5 g/dL    Albumin/Globulin Ratio 1.9 1.1 - 2.5   Bilirubin Total 0.4 0.0 - 1.2 mg/dL   Alkaline Phosphatase 54 39 - 117 IU/L   AST 25 0 - 40 IU/L   ALT 30 0 - 44 IU/L  Lipid Panel w/o Chol/HDL Ratio  Result Value Ref Range   Cholesterol, Total 210 (H) 100 - 199 mg/dL   Triglycerides 216 (H) 0 - 149 mg/dL   HDL 42 >39 mg/dL   VLDL Cholesterol Cal 43 (H) 5 - 40 mg/dL   LDL Calculated 125 (H) 0 - 99 mg/dL  TSH  Result Value Ref Range   TSH 3.000 0.450 - 4.500 uIU/mL      Assessment & Plan:   Problem List Items Addressed This Visit      Cardiovascular and Mediastinum   Essential hypertension, benign - Primary    Advised him to STOP the phentermine; start antihypertensive; try to follow DASH guidelines; close f/u      Relevant Medications   amLODipine (NORVASC) 5 MG tablet     Other   Obesity    advised him to STOP the phentermine with his hypertension, risk of stroke, heart attack      Relevant Orders   Comprehensive metabolic panel   Work-related stress    Encouraged him to start back with counseling; reviewed relaxation response with him; continue SSRI      Dyslipidemia    Reviewed lipid panel from Nov 2016; recheck next week fasting      Relevant Orders   Lipid Panel w/o Chol/HDL Ratio   Elevated serum creatinine    Suspect this is related to his muscle mass, large build, more than a marker of actual renal function decline; recheck next week      Relevant Orders   Comprehensive metabolic panel   Hx of gout    Glad to hear he is not drinking, no recent flares; will avoid HCTZ for BP; check uric acid with other labs next week      Relevant Orders   Uric acid      Follow up plan: Return in about 10 days (around 08/15/2015) for CMA visit only for blood pressure check, labs at that time too (fasting).  An after-visit summary was printed and given to the patient at [place].  Please see  the patient instructions which may contain other information and recommendations  beyond what is mentioned above in the assessment and plan.  Meds ordered this encounter  Medications  . DISCONTD: phentermine (ADIPEX-P) 37.5 MG tablet    Sig:   . amLODipine (NORVASC) 5 MG tablet    Sig: Take 1 tablet (5 mg total) by mouth daily.    Dispense:  30 tablet    Refill:  5    Orders Placed This Encounter  Procedures  . Uric acid  . Lipid Panel w/o Chol/HDL Ratio  . Comprehensive metabolic panel

## 2015-08-15 ENCOUNTER — Ambulatory Visit (INDEPENDENT_AMBULATORY_CARE_PROVIDER_SITE_OTHER): Payer: Managed Care, Other (non HMO)

## 2015-08-15 ENCOUNTER — Other Ambulatory Visit: Payer: Self-pay | Admitting: Family Medicine

## 2015-08-15 VITALS — BP 144/102 | HR 84 | Wt 351.0 lb

## 2015-08-15 DIAGNOSIS — I10 Essential (primary) hypertension: Secondary | ICD-10-CM | POA: Diagnosis not present

## 2015-08-16 LAB — COMPREHENSIVE METABOLIC PANEL
ALK PHOS: 70 IU/L (ref 39–117)
ALT: 31 IU/L (ref 0–44)
AST: 22 IU/L (ref 0–40)
Albumin/Globulin Ratio: 1.9 (ref 1.2–2.2)
Albumin: 4.6 g/dL (ref 3.5–5.5)
BUN/Creatinine Ratio: 10 (ref 9–20)
BUN: 15 mg/dL (ref 6–20)
Bilirubin Total: 0.3 mg/dL (ref 0.0–1.2)
CALCIUM: 9.6 mg/dL (ref 8.7–10.2)
CO2: 25 mmol/L (ref 18–29)
CREATININE: 1.51 mg/dL — AB (ref 0.76–1.27)
Chloride: 102 mmol/L (ref 96–106)
GFR calc Af Amer: 66 mL/min/{1.73_m2} (ref >59)
GFR calc non Af Amer: 57 mL/min/{1.73_m2} — ABNORMAL LOW (ref >59)
GLOBULIN, TOTAL: 2.4 g/dL (ref 1.5–4.5)
Glucose: 93 mg/dL (ref 65–99)
POTASSIUM: 5 mmol/L (ref 3.5–5.2)
SODIUM: 141 mmol/L (ref 134–144)
Total Protein: 7 g/dL (ref 6.0–8.5)

## 2015-08-16 LAB — LIPID PANEL W/O CHOL/HDL RATIO
CHOLESTEROL TOTAL: 199 mg/dL (ref 100–199)
HDL: 37 mg/dL — AB (ref >39)
LDL Calculated: 128 mg/dL — ABNORMAL HIGH (ref 0–99)
TRIGLYCERIDES: 169 mg/dL — AB (ref 0–149)
VLDL Cholesterol Cal: 34 mg/dL (ref 5–40)

## 2015-08-16 LAB — URIC ACID: Uric Acid: 7.9 mg/dL (ref 3.7–8.6)

## 2015-08-16 MED ORDER — AMLODIPINE BESYLATE 10 MG PO TABS: 10.0000 mg | ORAL_TABLET | Freq: Every day | ORAL | Status: DC

## 2015-08-16 NOTE — Progress Notes (Signed)
Pt here, elevated BP; increase amlo from 5 mg daily to 10 mg daily; new Rx sent

## 2015-08-16 NOTE — Assessment & Plan Note (Signed)
Increase amlodipine from 5 to 10 mg

## 2015-08-24 NOTE — Telephone Encounter (Signed)
Patient seen; I talked with him about lab results I suspect his Cr is elevated b/c of his size, much more muscle mass than average person being 6'7" and 350 pounds; let's get 24 hour urine creatinine clearance; also, LIMIT ibuprofen (he has been taking that); tylenol okay; uric acid up, but no gout flares, so limit EtOH, meats, gravies, etc.; diet discussed He agrees with plan

## 2015-08-24 NOTE — Assessment & Plan Note (Signed)
Will get 24 hour creatinine clearance

## 2015-08-26 ENCOUNTER — Other Ambulatory Visit: Payer: Self-pay | Admitting: Family Medicine

## 2015-09-10 ENCOUNTER — Telehealth: Payer: Self-pay | Admitting: Family Medicine

## 2015-09-10 DIAGNOSIS — W57XXXA Bitten or stung by nonvenomous insect and other nonvenomous arthropods, initial encounter: Secondary | ICD-10-CM | POA: Insufficient documentation

## 2015-09-10 NOTE — Telephone Encounter (Signed)
I put in the order for the Lyme disease test (that is a blood test) The order for 24 hour urine creatinine clearance is already in the system Thank you

## 2015-09-10 NOTE — Telephone Encounter (Signed)
Patient states that he is suppose to pick up a cup for a days worth of urine for kidney. Also requesting an order to check for lime disease. Has been bitten by several ticks (about 10 from waist to his toes) about a week ago. Feet tingling (which has never happened before), funny bone aching, several red wheap (not in the circle form that he can see).

## 2015-09-11 NOTE — Telephone Encounter (Signed)
Pt.notified

## 2016-01-27 ENCOUNTER — Telehealth: Payer: Self-pay | Admitting: Family Medicine

## 2016-01-27 MED ORDER — COLCHICINE 0.6 MG PO TABS: ORAL_TABLET | ORAL | 2 refills | Status: DC

## 2016-01-27 NOTE — Telephone Encounter (Signed)
Rx sent 

## 2016-01-27 NOTE — Telephone Encounter (Signed)
Patient is having gout flare up and is asking for a refill on Colchicine 0.6mg . Asking that yo please send to [facility]

## 2016-02-20 ENCOUNTER — Ambulatory Visit (INDEPENDENT_AMBULATORY_CARE_PROVIDER_SITE_OTHER): Payer: Managed Care, Other (non HMO) | Admitting: Family Medicine

## 2016-02-20 ENCOUNTER — Encounter: Payer: Self-pay | Admitting: Family Medicine

## 2016-02-20 DIAGNOSIS — M10079 Idiopathic gout, unspecified ankle and foot: Secondary | ICD-10-CM | POA: Diagnosis not present

## 2016-02-20 MED ORDER — INDOMETHACIN 50 MG PO CAPS: 50.0000 mg | ORAL_CAPSULE | Freq: Three times a day (TID) | ORAL | 0 refills | Status: DC

## 2016-02-20 NOTE — Progress Notes (Signed)
BP 138/90 (BP Location: Left Arm, Patient Position: Sitting, Cuff Size: Large)   Pulse 80   Temp 97.8 F (36.6 C) (Oral)   Resp 14   Ht 6\' 7"  (2.007 m)   Wt (!) 377 lb 3 oz (171.1 kg)   SpO2 97%   BMI 42.49 kg/m    Subjective:    Patient ID: Darren Robinson, male    DOB: 05-May-1975, 40 y.o.   MRN: [id number]  HPI: Perez Mickels is a 40 y.o. male  Chief Complaint  Patient presents with  . Gout   Patient is here for an acute visit with a flare of what he thinks is gout in his 1st MTP He eats a lot of chicken Not much shrimp or seafood Change in system First flare one month ago, never really went away Previous to that was maybe a year ago Tart cherry helped Colchicine, needed second round Got another round and that brought it down  Depression screen [facility] 2/9 02/20/2016 08/05/2015 02/21/2015 02/07/2015  Decreased Interest 3 0 0 1  Down, Depressed, Hopeless 3 0 3 3  PHQ - 2 Score 6 0 3 4  Altered sleeping 3 - 3 0  Tired, decreased energy 1 - 3 3  Change in appetite 3 - 3 3  Feeling bad or failure about yourself  0 - 0 0  Trouble concentrating 0 - 3 3  Moving slowly or fidgety/restless 0 - 0 0  Suicidal thoughts 0 - 0 0  PHQ-9 Score 13 - 15 13  Difficult doing work/chores Not difficult at all - Somewhat difficult Very difficult   Relevant past medical, surgical, family and social history reviewed Past Medical History:  Diagnosis Date  . Essential hypertension, benign 02/11/2015  . Fatigue   . Gout   . Hx of gout 08/05/2015  . Hypertension   . Obesity 02/11/2015  . OSA (obstructive sleep apnea)   . Sleep apnea   . Vitamin D deficiency disease    Past Surgical History:  Procedure Laterality Date  . APPENDECTOMY     Family History  Problem Relation Age of Onset  . Alcohol abuse Mother   . Arthritis Mother   . Heart disease Mother   . Mental illness Mother   . Thyroid disease Mother   . Diabetes Father   . Heart attack Father   . Kidney disease Father     On  Dialysis  . Heart disease Father   . Hyperlipidemia Father   . Thyroid disease Sister   . Hyperlipidemia Sister   . Hypertension Sister   . Lung disease Maternal Grandfather   . Cancer Neg Hx   . Stroke Neg Hx    Social History  Substance Use Topics  . Smoking status: Former Smoker    Packs/day: 1.00    Years: 10.00    Types: Cigarettes    Quit date: 04/06/2004  . Smokeless tobacco: Current User    Types: Snuff, Chew  . Alcohol use 6.0 oz/week    10 Standard drinks or equivalent per week     Comment: Social    Interim medical history since last visit reviewed. Allergies and medications reviewed  Review of Systems Per HPI unless specifically indicated above     Objective:    BP 138/90 (BP Location: Left Arm, Patient Position: Sitting, Cuff Size: Large)   Pulse 80   Temp 97.8 F (36.6 C) (Oral)   Resp 14   Ht 6\' 7"  (2.007 m)  Wt (!) 377 lb 3 oz (171.1 kg)   SpO2 97%   BMI 42.49 kg/m   Wt Readings from Last 3 Encounters:  02/20/16 (!) 377 lb 3 oz (171.1 kg)  08/15/15 (!) 351 lb (159.2 kg)  08/05/15 (!) 350 lb (158.8 kg)   muscle mass gaining Physical Exam  Constitutional: He appears well-developed and well-nourished.  Cardiovascular: Normal rate.   Pulmonary/Chest: Effort normal.  Musculoskeletal:  Swollen erythematous first MTP; limited ROM; sensation intact; no evidence of foreign body  Psychiatric: He has a normal mood and affect. His mood appears not anxious. He does not exhibit a depressed mood.   Results for orders placed or performed in visit on 08/15/15  Comprehensive metabolic panel  Result Value Ref Range   Glucose 93 65 - 99 mg/dL   BUN 15 6 - 20 mg/dL   Creatinine, Ser 1.51 (H) 0.76 - 1.27 mg/dL   GFR calc non Af Amer 57 (L) >59 mL/min/1.73   GFR calc Af Amer 66 >59 mL/min/1.73   BUN/Creatinine Ratio 10 9 - 20   Sodium 141 134 - 144 mmol/L   Potassium 5.0 3.5 - 5.2 mmol/L   Chloride 102 96 - 106 mmol/L   CO2 25 18 - 29 mmol/L   Calcium 9.6  8.7 - 10.2 mg/dL   Total Protein 7.0 6.0 - 8.5 g/dL   Albumin 4.6 3.5 - 5.5 g/dL   Globulin, Total 2.4 1.5 - 4.5 g/dL   Albumin/Globulin Ratio 1.9 1.2 - 2.2   Bilirubin Total 0.3 0.0 - 1.2 mg/dL   Alkaline Phosphatase 70 39 - 117 IU/L   AST 22 0 - 40 IU/L   ALT 31 0 - 44 IU/L  Lipid Panel w/o Chol/HDL Ratio  Result Value Ref Range   Cholesterol, Total 199 100 - 199 mg/dL   Triglycerides 169 (H) 0 - 149 mg/dL   HDL 37 (L) >39 mg/dL   VLDL Cholesterol Cal 34 5 - 40 mg/dL   LDL Calculated 128 (H) 0 - 99 mg/dL  Uric acid  Result Value Ref Range   Uric Acid 7.9 3.7 - 8.6 mg/dL      Assessment & Plan:   Problem List Items Addressed This Visit      Other   Acute gout    Discussed dx; offered labs, xray; he declined; 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  An after-visit summary was printed and given to the patient at [place].  Please see the patient instructions which may contain other information and recommendations beyond what is mentioned above in the assessment and plan.  Meds ordered this encounter  Medications  . indomethacin (INDOCIN) 50 MG capsule    Sig: Take 1 capsule (50 mg total) by mouth 3 (three) times daily with meals. Take with food    Dispense:  30 capsule    Refill:  0

## 2016-02-20 NOTE — Patient Instructions (Addendum)
Really watch the diet Start the indomethacin If not getting better, please call me  12 Ways to Curb Anxiety  ?Anxiety is normal human sensation. It is what helped our ancestors survive the pitfalls of the wilderness. Anxiety is defined as experiencing worry or nervousness about an imminent event or something with an uncertain outcome. It is a feeling experienced by most people at some point in their lives. Anxiety can be triggered by a very personal issue, such as the illness of a loved one, or an event of global proportions, such as a refugee crisis. Some of the symptoms of anxiety are:  Feeling restless.  Having a feeling of impending danger.  Increased heart rate.  Rapid breathing. Sweating.  Shaking.  Weakness or feeling tired.  Difficulty concentrating on anything except the current worry.  Insomnia.  Stomach or bowel problems. What can we do about anxiety we may be feeling? There are many techniques to help manage stress and relax. Here are 12 ways you can reduce your anxiety almost immediately: 1. Turn off the constant feed of information. Take a social media sabbatical. Studies have shown that social media directly contributes to social anxiety.  2. Monitor your television viewing habits. Are you watching shows that are also contributing to your anxiety, such as 24-hour news stations? Try watching something else, or better yet, nothing at all. Instead, listen to music, read an inspirational book or practice a hobby. 3. Eat nutritious meals. Also, don't skip meals and keep healthful snacks on hand. Hunger and poor diet contributes to feeling anxious. 4. Sleep. Sleeping on a regular schedule for at least seven to eight hours a night will do wonders for your outlook when you are awake. 5. Exercise. Regular exercise will help rid your body of that anxious energy and help you get more restful sleep. 6. Try deep (diaphragmatic) breathing. Inhale slowly through your nose for five seconds and  exhale through your mouth. 7. Practice acceptance and gratitude. When anxiety hits, accept that there are things out of your control that shouldn't be of immediate concern.  8. Seek out humor. When anxiety strikes, watch a funny video, read jokes or call a friend who makes you laugh. Laughter is healing for our bodies and releases endorphins that are calming. 9. Stay positive. Take the effort to replace negative thoughts with positive ones. Try to see a stressful situation in a positive light. Try to come up with solutions rather than dwelling on the problem. 10. Figure out what triggers your anxiety. Keep a journal and make note of anxious moments and the events surrounding them. This will help you identify triggers you can avoid or even eliminate. 11. Talk to someone. Let a trusted friend, family member or even trained professional know that you are feeling overwhelmed and anxious. Verbalize what you are feeling and why.  12. Volunteer. If your anxiety is triggered by a crisis on a large scale, become an advocate and work to resolve the problem that is causing you unease. Anxiety is often unwelcome and can become overwhelming. If not kept in check, it can become a disorder that could require medical treatment. However, if you take the time to care for yourself and avoid the triggers that make you anxious, you will be able to find moments of relaxation and clarity that make your life much more enjoyable.

## 2016-02-28 DIAGNOSIS — M109 Gout, unspecified: Secondary | ICD-10-CM | POA: Insufficient documentation

## 2016-02-28 NOTE — Assessment & Plan Note (Signed)
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

## 2016-05-11 ENCOUNTER — Other Ambulatory Visit: Payer: Self-pay

## 2016-05-11 MED ORDER — SERTRALINE HCL 100 MG PO TABS: ORAL_TABLET | ORAL | 5 refills | Status: DC

## 2016-08-03 ENCOUNTER — Other Ambulatory Visit: Payer: Self-pay | Admitting: Family Medicine

## 2016-08-03 MED ORDER — COLCHICINE 0.6 MG PO TABS: ORAL_TABLET | ORAL | 2 refills | Status: DC

## 2016-08-03 NOTE — Telephone Encounter (Signed)
Pt needs refill on his gout medication, Colchicine. Pt does have an appt on Thursday of this week. Dispensing optician.

## 2016-08-06 ENCOUNTER — Ambulatory Visit: Payer: Managed Care, Other (non HMO) | Admitting: Family Medicine

## 2016-08-10 ENCOUNTER — Encounter: Payer: Self-pay | Admitting: Family Medicine

## 2016-08-10 ENCOUNTER — Ambulatory Visit (INDEPENDENT_AMBULATORY_CARE_PROVIDER_SITE_OTHER): Payer: Managed Care, Other (non HMO) | Admitting: Family Medicine

## 2016-08-10 VITALS — BP 138/94 | HR 74 | Temp 98.1°F | Resp 14 | Wt 370.2 lb

## 2016-08-10 DIAGNOSIS — M10071 Idiopathic gout, right ankle and foot: Secondary | ICD-10-CM

## 2016-08-10 DIAGNOSIS — R7989 Other specified abnormal findings of blood chemistry: Secondary | ICD-10-CM

## 2016-08-10 DIAGNOSIS — E785 Hyperlipidemia, unspecified: Secondary | ICD-10-CM | POA: Diagnosis not present

## 2016-08-10 DIAGNOSIS — Z789 Other specified health status: Secondary | ICD-10-CM

## 2016-08-10 DIAGNOSIS — Z7289 Other problems related to lifestyle: Secondary | ICD-10-CM

## 2016-08-10 DIAGNOSIS — M10079 Idiopathic gout, unspecified ankle and foot: Secondary | ICD-10-CM | POA: Diagnosis not present

## 2016-08-10 DIAGNOSIS — I10 Essential (primary) hypertension: Secondary | ICD-10-CM | POA: Diagnosis not present

## 2016-08-10 DIAGNOSIS — Z5181 Encounter for therapeutic drug level monitoring: Secondary | ICD-10-CM | POA: Diagnosis not present

## 2016-08-10 DIAGNOSIS — M109 Gout, unspecified: Secondary | ICD-10-CM | POA: Insufficient documentation

## 2016-08-10 LAB — COMPLETE METABOLIC PANEL WITH GFR
ALBUMIN: 4.5 g/dL (ref 3.6–5.1)
ALK PHOS: 47 U/L (ref 40–115)
ALT: 27 U/L (ref 9–46)
AST: 22 U/L (ref 10–40)
BILIRUBIN TOTAL: 0.5 mg/dL (ref 0.2–1.2)
BUN: 15 mg/dL (ref 7–25)
CALCIUM: 9.4 mg/dL (ref 8.6–10.3)
CO2: 26 mmol/L (ref 20–31)
CREATININE: 1.24 mg/dL (ref 0.60–1.35)
Chloride: 107 mmol/L (ref 98–110)
GFR, EST AFRICAN AMERICAN: 84 mL/min (ref >=60)
GFR, Est Non African American: 72 mL/min (ref >=60)
Glucose, Bld: 92 mg/dL (ref 65–99)
Potassium: 4.3 mmol/L (ref 3.5–5.3)
Sodium: 140 mmol/L (ref 135–146)
TOTAL PROTEIN: 7.1 g/dL (ref 6.1–8.1)

## 2016-08-10 LAB — LIPID PANEL
CHOLESTEROL: 213 mg/dL — AB (ref <200)
HDL: 41 mg/dL (ref >40)
LDL Cholesterol: 137 mg/dL — ABNORMAL HIGH (ref <100)
TRIGLYCERIDES: 175 mg/dL — AB (ref <150)
Total CHOL/HDL Ratio: 5.2 Ratio — ABNORMAL HIGH (ref <5.0)
VLDL: 35 mg/dL — AB (ref <30)

## 2016-08-10 LAB — CBC WITH DIFFERENTIAL/PLATELET
BASOS PCT: 1 %
Basophils Absolute: 59 cells/uL (ref 0–200)
EOS ABS: 295 {cells}/uL (ref 15–500)
Eosinophils Relative: 5 %
HEMATOCRIT: 45.4 % (ref 38.5–50.0)
HEMOGLOBIN: 15.2 g/dL (ref 13.2–17.1)
LYMPHS ABS: 2183 {cells}/uL (ref 850–3900)
LYMPHS PCT: 37 %
MCH: 29.9 pg (ref 27.0–33.0)
MCHC: 33.5 g/dL (ref 32.0–36.0)
MCV: 89.4 fL (ref 80.0–100.0)
MONO ABS: 472 {cells}/uL (ref 200–950)
MPV: 9.9 fL (ref 7.5–12.5)
Monocytes Relative: 8 %
Neutro Abs: 2891 cells/uL (ref 1500–7800)
Neutrophils Relative %: 49 %
Platelets: 198 10*3/uL (ref 140–400)
RBC: 5.08 MIL/uL (ref 4.20–5.80)
RDW: 14.7 % (ref 11.0–15.0)
WBC: 5.9 10*3/uL (ref 3.8–10.8)

## 2016-08-10 LAB — URIC ACID: Uric Acid, Serum: 7.3 mg/dL (ref 4.0–8.0)

## 2016-08-10 MED ORDER — INDOMETHACIN 50 MG PO CAPS: 50.0000 mg | ORAL_CAPSULE | Freq: Three times a day (TID) | ORAL | 0 refills | Status: DC | PRN

## 2016-08-10 NOTE — Assessment & Plan Note (Signed)
Check uric acid. 

## 2016-08-10 NOTE — Assessment & Plan Note (Signed)
Shared with patient and his wife the safe limits for alcohol intake, that is no more than 14 drinks per week and no more than 4 drinks on any occasion; showed him the web site for moderate drinking; explained that I have resources available and that if he has difficulty cutting back on alcohol on his own, to please contact me; discussed other ways to deal with stress; shared relaxation response, see AVS; he denies drinking and driving

## 2016-08-10 NOTE — Progress Notes (Signed)
BP (!) 138/94   Pulse 74   Temp 98.1 F (36.7 C) (Oral)   Resp 14   Wt (!) 370 lb 3.2 oz (167.9 kg)   SpO2 96%   BMI 41.70 kg/m    Subjective:    Patient ID: Darren Robinson, male    DOB: [birth year], 41 y.o.   MRN: [id number]  HPI: Darren Robinson is a 41 y.o. male  Chief Complaint  Patient presents with  . Gout    right foot 3rd toe   Patient is here with his wife HPI Gout flare; involving the 3rd toe on the RIGHt foot; started last week; alcohol intake more than usual; had 12 beers at one time; drinks to blow off steam; mother was an alcoholic; he denies drinking and driving Fasting this morning; pizza last night He has high blood pressure; he has not taken his amlodipine today; not checking BP away from the doctor, but his wife is an EMT (so I think he was implying that she could check at home) When eating, he rushes to the bathroom, stools; it can be after eating anything; not just fatty food; loose and falling apart; no odd smell; no recent travel; no fevers; no blood or mucous in the stool; no belly pain; lots of stress lately; trying to finish up his master's degree in accounting, has two weeks to go; working night shift at police [employer]  Depression screen [facility] 2/9 08/10/2016 02/20/2016 08/05/2015 02/21/2015 02/07/2015  Decreased Interest 0 3 0 0 1  Down, Depressed, Hopeless 0 3 0 3 3  PHQ - 2 Score 0 6 0 3 4  Altered sleeping - 3 - 3 0  Tired, decreased energy - 1 - 3 3  Change in appetite - 3 - 3 3  Feeling bad or failure about yourself  - 0 - 0 0  Trouble concentrating - 0 - 3 3  Moving slowly or fidgety/restless - 0 - 0 0  Suicidal thoughts - 0 - 0 0  PHQ-9 Score - 13 - 15 13  Difficult doing work/chores - Not difficult at all - Somewhat difficult Very difficult   Relevant past medical, surgical, family and social history reviewed Past Medical History:  Diagnosis Date  . Essential hypertension, benign 02/11/2015  . Fatigue   . Gout   . Hx of gout 08/05/2015  .  Hypertension   . Morbid obesity ([place]) 02/11/2015  . Obesity 02/11/2015  . OSA (obstructive sleep apnea)   . Sleep apnea   . Vitamin D deficiency disease    Past Surgical History:  Procedure Laterality Date  . APPENDECTOMY     Family History  Problem Relation Age of Onset  . Alcohol abuse Mother   . Arthritis Mother   . Heart disease Mother   . Mental illness Mother   . Thyroid disease Mother   . Diabetes Father   . Heart attack Father   . Kidney disease Father     On Dialysis  . Heart disease Father   . Hyperlipidemia Father   . Thyroid disease Sister   . Hyperlipidemia Sister   . Hypertension Sister   . Lung disease Maternal Grandfather   . Cancer Neg Hx   . Stroke Neg Hx   MD notes: both parents have passed away; he and his brother and sister talk about their health issues to try to share medical information  Social History   Social History  . Marital status: Married    Spouse name:  N/A  . Number of children: N/A  . Years of education: N/A   Occupational History  . Not on file.   Social History Main Topics  . Smoking status: Former Smoker    Packs/day: 1.00    Years: 10.00    Types: Cigarettes    Quit date: 04/06/2004  . Smokeless tobacco: Current User    Types: Snuff, Chew  . Alcohol use 6.0 oz/week    10 Standard drinks or equivalent per week     Comment: Social  . Drug use: No  . Sexual activity: Not on file   Other Topics Concern  . Not on file   Social History Narrative  . No narrative on file   Interim medical history since last visit reviewed. Allergies and medications reviewed  Review of Systems Per HPI unless specifically indicated above     Objective:    BP (!) 138/94   Pulse 74   Temp 98.1 F (36.7 C) (Oral)   Resp 14   Wt (!) 370 lb 3.2 oz (167.9 kg)   SpO2 96%   BMI 41.70 kg/m   Wt Readings from Last 3 Encounters:  08/10/16 (!) 370 lb 3.2 oz (167.9 kg)  02/20/16 (!) 377 lb 3 oz (171.1 kg)  08/15/15 (!) 351 lb (159.2 kg)     Physical Exam  Constitutional: He appears well-developed and well-nourished.  Morbidly obese  Eyes: EOM are normal. No scleral icterus.  Neck: No JVD present. No thyromegaly present.  Cardiovascular: Normal rate and regular rhythm.   Pulses:      Dorsalis pedis pulses are 2+ on the right side.  Pulmonary/Chest: Effort normal and breath sounds normal.  Abdominal: Soft. Bowel sounds are normal. He exhibits no distension. There is no tenderness. There is no guarding.  Musculoskeletal: He exhibits no edema.  Swollen erythematous third toe on the RIGHT foot; limited ROM; sensation intact; no evidence of foreign body visible  Neurological: He is alert.  Skin: No pallor.  Third toe on the right foot is swollen and erythematous  Psychiatric: He has a normal mood and affect. His mood appears not anxious. He does not exhibit a depressed mood.    Results for orders placed or performed in visit on 08/15/15  Comprehensive metabolic panel  Result Value Ref Range   Glucose 93 65 - 99 mg/dL   BUN 15 6 - 20 mg/dL   Creatinine, Ser 1.51 (H) 0.76 - 1.27 mg/dL   GFR calc non Af Amer 57 (L) >59 mL/min/1.73   GFR calc Af Amer 66 >59 mL/min/1.73   BUN/Creatinine Ratio 10 9 - 20   Sodium 141 134 - 144 mmol/L   Potassium 5.0 3.5 - 5.2 mmol/L   Chloride 102 96 - 106 mmol/L   CO2 25 18 - 29 mmol/L   Calcium 9.6 8.7 - 10.2 mg/dL   Total Protein 7.0 6.0 - 8.5 g/dL   Albumin 4.6 3.5 - 5.5 g/dL   Globulin, Total 2.4 1.5 - 4.5 g/dL   Albumin/Globulin Ratio 1.9 1.2 - 2.2   Bilirubin Total 0.3 0.0 - 1.2 mg/dL   Alkaline Phosphatase 70 39 - 117 IU/L   AST 22 0 - 40 IU/L   ALT 31 0 - 44 IU/L  Lipid Panel w/o Chol/HDL Ratio  Result Value Ref Range   Cholesterol, Total 199 100 - 199 mg/dL   Triglycerides 169 (H) 0 - 149 mg/dL   HDL 37 (L) >39 mg/dL   VLDL Cholesterol Cal 34 5 -  40 mg/dL   LDL Calculated 128 (H) 0 - 99 mg/dL  Uric acid  Result Value Ref Range   Uric Acid 7.9 3.7 - 8.6 mg/dL        Assessment & Plan:   Problem List Items Addressed This Visit      Cardiovascular and Mediastinum   Essential hypertension, benign    Encouraged DASH guidelines; start back on CCB; encouraged weight loss; explained excessive alcohol likely impacting his BP as well as his gout; return in 2 weeks for recheck BP        Musculoskeletal and Integument   Gout of right foot    Explained that his excessive alcohol intake likely responsible; cut back, here to help if any trouble; avoid purine-rich foods; make soup out of vegetable stock instead of beef or chicken stock, e.g.; indomethacin Rx; check labs today      Relevant Medications   indomethacin (INDOCIN) 50 MG capsule     Other   Medication monitoring encounter   Relevant Orders   CBC with Differential/Platelet   COMPLETE METABOLIC PANEL WITH GFR   Elevated serum creatinine    Check labs today; suspect that is due to sheer muscle mass given his large body type; 24 hour creatinine clearance had been ordered last year but not done      Dyslipidemia    Check labs today      Relevant Orders   Lipid panel   Alcohol use    Shared with patient and his wife the safe limits for alcohol intake, that is no more than 14 drinks per week and no more than 4 drinks on any occasion; showed him the web site for moderate drinking; explained that I have resources available and that if he has difficulty cutting back on alcohol on his own, to please contact me; discussed other ways to deal with stress; shared relaxation response, see AVS; he denies drinking and driving      Acute gout - Primary    Check uric acid      Relevant Orders   Uric acid       Follow up plan: Return in about 2 weeks (around 08/24/2016) for blood pressure recheck with CMA.  An after-visit summary was printed and given to the patient at [facility].  Please see the patient instructions which may contain other information and recommendations beyond what is mentioned above  in the assessment and plan.  Meds ordered this encounter  Medications  . indomethacin (INDOCIN) 50 MG capsule    Sig: Take 1 capsule (50 mg total) by mouth 3 (three) times daily as needed. Take with food    Dispense:  21 capsule    Refill:  0    Orders Placed This Encounter  Procedures  . CBC with Differential/Platelet  . COMPLETE METABOLIC PANEL WITH GFR  . Lipid panel  . Uric acid

## 2016-08-10 NOTE — Assessment & Plan Note (Addendum)
Check labs today; suspect that is due to sheer muscle mass given his large body type; 24 hour creatinine clearance had been ordered last year but not done

## 2016-08-10 NOTE — Assessment & Plan Note (Signed)
Explained that his excessive alcohol intake likely responsible; cut back, here to help if any trouble; avoid purine-rich foods; make soup out of vegetable stock instead of beef or chicken stock, e.g.; indomethacin Rx; check labs today

## 2016-08-10 NOTE — Assessment & Plan Note (Signed)
Encouraged to work on weight loss

## 2016-08-10 NOTE — Assessment & Plan Note (Signed)
Check labs today.

## 2016-08-10 NOTE — Assessment & Plan Note (Addendum)
Encouraged DASH guidelines; start back on CCB; encouraged weight loss; explained excessive alcohol likely impacting his BP as well as his gout; return in 2 weeks for recheck BP

## 2016-08-10 NOTE — Patient Instructions (Addendum)
Start probiotics daily for the next month Limit alcohol to no more than 14 drinks per week and no more than 4 on any occasion Check out moderate drinking web site AntiHot.gl.aspx?p=register_login Let me know if you have difficulty sticking to the recommended amounts I have resouces and I am here to help you Check out the information at familydoctor.org entitled "Nutrition for Weight Loss: What You Need to Know about Fad Diets" Try to lose between 1-2 pounds per week by taking in fewer calories and burning off more calories You can succeed by limiting portions, limiting foods dense in calories and fat, becoming more active, and drinking 8 glasses of water a day (64 ounces) Don't skip meals, especially breakfast, as skipping meals may alter your metabolism Do not use over-the-counter weight loss pills or gimmicks that claim rapid weight loss A healthy BMI (or body mass index) is between 18.5 and 24.9 You can calculate your ideal BMI at the NIH website ClubMonetize.fr   Steps to Elicit the Relaxation Response The following is the technique reprinted with permission from Dr. Billie Ruddy book The Relaxation Response pages 162-163 1. Sit quietly in a comfortable position. 2. Close your eyes. 3. Deeply relax all your muscles,  beginning at your feet and progressing up to your face.  Keep them relaxed. 4. Breathe through your nose.  Become aware of your breathing.  As you breathe out, say the word, "one"*,  silently to yourself. For example,  breathe in ... out, "one",- in .. out, "one", etc.  Breathe easily and naturally. 5. Continue for 10 to 20 minutes.  You may open your eyes to check the time, but do not use an alarm.  When you finish, sit quietly for several minutes,  at first with your eyes closed and later with your eyes opened.  Do not stand up for a few minutes. 6. Do not worry about  whether you are successful  in achieving a deep level of relaxation.  Maintain a passive attitude and permit relaxation to occur at its own pace.  When distracting thoughts occur,  try to ignore them by not dwelling upon them  and return to repeating "one."  With practice, the response should come with little effort.  Practice the technique once or twice daily,  but not within two hours after any meal,  since the digestive processes seem to interfere with  the elicitation of the Relaxation Response. * It is better to use a soothing, mellifluous sound, preferably with no meaning. or association, to avoid stimulation of unnecessary thoughts - a mantra.     Alcohol Use Disorder Alcohol use disorder is when your drinking disrupts your daily life. When you have this condition, you drink too much alcohol and you cannot control your drinking. Alcohol use disorder can cause serious problems with your physical health. It can affect your brain, heart, liver, pancreas, immune system, stomach, and intestines. Alcohol use disorder can increase your risk for certain cancers and cause problems with your mental health, such as depression, anxiety, psychosis, delirium, and dementia. People with this disorder risk hurting themselves and others. What are the causes? This condition is caused by drinking too much alcohol over time. It is not caused by drinking too much alcohol only one or two times. Some people with this condition drink alcohol to cope with or escape from negative life events. Others drink to relieve pain or symptoms of mental illness. What increases the risk? You are more likely to develop this condition  if:  You have a family history of alcohol use disorder.  Your culture encourages drinking to the point of intoxication, or makes alcohol easy to get.  You had a mood or conduct disorder in childhood.  You have been a victim of abuse.  You are an adolescent and:  You have poor grades  or difficulties in school.  Your caregivers do not talk to you about saying no to alcohol, or supervise your activities.  You are impulsive or you have trouble with self-control. What are the signs or symptoms? Symptoms of this condition include:  Drinkingmore than you want to.  Drinking for longer than you want to.  Trying several times to drink less or to control your drinking.  Spending a lot of time getting alcohol, drinking, or recovering from drinking.  Craving alcohol.  Having problems at work, at school, or at home due to drinking.  Having problems in relationships due to drinking.  Drinking when it is dangerous to drink, such as before driving a car.  Continuing to drink even though you know you might have a physical or mental problem related to drinking.  Needing more and more alcohol to get the same effect you want from the alcohol (building up tolerance).  Having symptoms of withdrawal when you stop drinking. Symptoms of withdrawal include:  Fatigue.  Nightmares.  Trouble sleeping.  Depression.  Anxiety.  Fever.  Seizures.  Severe confusion.  Feeling or seeing things that are not there (hallucinations).  Tremors.  Rapid heart rate.  Rapid breathing.  High blood pressure.  Drinking to avoid symptoms of withdrawal. How is this diagnosed? This condition is diagnosed with an assessment. Your health care provider may start the assessment by asking three or four questions about your drinking. Your health care provider may perform a physical exam or do lab tests to see if you have physical problems resulting from alcohol use. She or he may refer you to a mental health professional for evaluation. How is this treated? Some people with alcohol use disorder are able to reduce their alcohol use to low-risk levels. Others need to completely quit drinking alcohol. When necessary, mental health professionals with specialized training in substance use  treatment can help. Your health care provider can help you decide how severe your alcohol use disorder is and what type of treatment you need. The following forms of treatment are available:  Detoxification. Detoxification involves quitting drinking and using prescription medicines within the first week to help lessen withdrawal symptoms. This treatment is important for people who have had withdrawal symptoms before and for heavy drinkers who are likely to have withdrawal symptoms. Alcohol withdrawal can be dangerous, and in severe cases, it can cause death. Detoxification may be provided in a home, community, or primary care setting, or in a hospital or substance use treatment facility.  Counseling. This treatment is also called talk therapy. It is provided by substance use treatment counselors. A counselor can address the reasons you use alcohol and suggest ways to keep you from drinking again or to prevent problem drinking. The goals of talk therapy are to:  Find healthy activities and ways for you to cope with stress.  Identify and avoid the things that trigger your alcohol use.  Help you learn how to handle cravings.  Medicines.Medicines can help treat alcohol use disorder by:  Decreasing alcohol cravings.  Decreasing the positive feeling you have when you drink alcohol.  Causing an uncomfortable physical reaction when you drink alcohol (aversion  therapy).  Support groups. Support groups are led by people who have quit drinking. They provide emotional support, advice, and guidance. These forms of treatment are often combined. Some people with this condition benefit from a combination of treatments provided by specialized substance use treatment centers. Follow these instructions at home:  Take over-the-counter and prescription medicines only as told by your health care provider.  Check with your health care provider before starting any new medicines.  Ask friends and family members  not to offer you alcohol.  Avoid situations where alcohol is served, including gatherings where others are drinking alcohol.  Create a plan for what to do when you are tempted to use alcohol.  Find hobbies or activities that you enjoy that do not include alcohol.  Keep all follow-up visits as told by your health care provider. This is important. How is this prevented?  If you drink, limit alcohol intake to no more than 1 drink a day for nonpregnant women and 2 drinks a day for men. One drink equals 12 oz of beer, 5 oz of wine, or 1 oz of hard liquor.  If you have a mental health condition, get treatment and support.  Do not give alcohol to adolescents.  If you are an adolescent:  Do not drink alcohol.  Do not be afraid to say no if someone offers you alcohol. Speak up about why you do not want to drink. You can be a positive role model for your friends and set a good example for those around you by not drinking alcohol.  If your friends drink, spend time with others who do not drink alcohol. Make new friends who do not use alcohol.  Find healthy ways to manage stress and emotions, such as meditation or deep breathing, exercise, spending time in nature, listening to music, or talking with a trusted friend or family member. Contact a health care provider if:  You are not able to take your medicines as told.  Your symptoms get worse.  You return to drinking alcohol (relapse) and your symptoms get worse. Get help right away if:  You have thoughts about hurting yourself or others. If you ever feel like you may hurt yourself or others, or have thoughts about taking your own life, get help right away. You can go to your nearest emergency department or call:  Your local emergency services (911 in the [place]  A suicide crisis helpline, such as the [facility] at 952-231-7917. This is open 24 hours a day. Summary  Alcohol use disorder is when your drinking  disrupts your daily life. When you have this condition, you drink too much alcohol and you cannot control your drinking.  Treatment may include detoxification, counseling, medicine, and support groups.  Ask friends and family members not to offer you alcohol. Avoid situations where alcohol is served.  Get help right away if you have thoughts about hurting yourself or others. This information is not intended to replace advice given to you by your health care provider. Make sure you discuss any questions you have with your health care provider. Document Released: 04/30/2004 Document Revised: 12/19/2015 Document Reviewed: 12/19/2015 Elsevier Interactive Patient Education  2017 [place].  Gout Gout is painful swelling that can happen in some of your joints. Gout is a type of arthritis. This condition is caused by having too much uric acid in your body. Uric acid is a chemical that is made when your body breaks down substances called purines. If  your body has too much uric acid, sharp crystals can form and build up in your joints. This causes pain and swelling. Gout attacks can happen quickly and be very painful (acute gout). Over time, the attacks can affect more joints and happen more often (chronic gout). Follow these instructions at home: During a Gout Attack   If directed, put ice on the painful area:  Put ice in a plastic bag.  Place a towel between your skin and the bag.  Leave the ice on for 20 minutes, 2-3 times a day.  Rest the joint as much as possible. If the joint is in your leg, you may be given crutches to use.  Raise (elevate) the painful joint above the level of your heart as often as you can.  Drink enough fluids to keep your pee (urine) clear or pale yellow.  Take over-the-counter and prescription medicines only as told by your doctor.  Do not drive or use heavy machinery while taking prescription pain medicine.  Follow instructions from your doctor about what  you can or cannot eat and drink.  Return to your normal activities as told by your doctor. Ask your doctor what activities are safe for you. Avoiding Future Gout Attacks   Follow a low-purine diet as told by a specialist (dietitian) or your doctor. Avoid foods and drinks that have a lot of purines, such as:  Liver.  Kidney.  Anchovies.  Asparagus.  Herring.  Mushrooms  Mussels.  Beer.  Limit alcohol intake to no more than 1 drink a day for nonpregnant women and 2 drinks a day for men. One drink equals 12 oz of beer, 5 oz of wine, or 1 oz of hard liquor.  Stay at a healthy weight or lose weight if you are overweight. If you want to lose weight, talk with your doctor. It is important that you do not lose weight too fast.  Start or continue an exercise plan as told by your doctor.  Drink enough fluids to keep your pee clear or pale yellow.  Take over-the-counter and prescription medicines only as told by your doctor.  Keep all follow-up visits as told by your doctor. This is important. Contact a doctor if:  You have another gout attack.  You still have symptoms of a gout attack after10 days of treatment.  You have problems (side effects) because of your medicines.  You have chills or a fever.  You have burning pain when you pee (urinate).  You have pain in your lower back or belly. Get help right away if:  You have very bad pain.  Your pain cannot be controlled.  You cannot pee. This information is not intended to replace advice given to you by your health care provider. Make sure you discuss any questions you have with your health care provider. Document Released: 12/31/2007 Document Revised: 08/29/2015 Document Reviewed: 01/03/2015 Elsevier Interactive Patient Education  2017 [place] DASH stands for "Dietary Approaches to Stop Hypertension." The DASH eating plan is a healthy eating plan that has been shown to reduce high blood  pressure (hypertension). It may also reduce your risk for type 2 diabetes, heart disease, and stroke. The DASH eating plan may also help with weight loss. What are tips for following this plan? General guidelines   Avoid eating more than 2,300 mg (milligrams) of salt (sodium) a day. If you have hypertension, you may need to reduce your sodium intake to 1,500 mg a day.  Limit alcohol intake to no more than 1 drink a day for nonpregnant women and 2 drinks a day for men. One drink equals 12 oz of beer, 5 oz of wine, or 1 oz of hard liquor.  Work with your health care provider to maintain a healthy body weight or to lose weight. Ask what an ideal weight is for you.  Get at least 30 minutes of exercise that causes your heart to beat faster (aerobic exercise) most days of the week. Activities may include walking, swimming, or biking.  Work with your health care provider or diet and nutrition specialist (dietitian) to adjust your eating plan to your individual calorie needs. Reading food labels   Check food labels for the amount of sodium per serving. Choose foods with less than 5 percent of the Daily Value of sodium. Generally, foods with less than 300 mg of sodium per serving fit into this eating plan.  To find whole grains, look for the word "whole" as the first word in the ingredient list. Shopping   Buy products labeled as "low-sodium" or "no salt added."  Buy fresh foods. Avoid canned foods and premade or frozen meals. Cooking   Avoid adding salt when cooking. Use salt-free seasonings or herbs instead of table salt or sea salt. Check with your health care provider or pharmacist before using salt substitutes.  Do not fry foods. Cook foods using healthy methods such as baking, boiling, grilling, and broiling instead.  Cook with heart-healthy oils, such as olive, canola, soybean, or sunflower oil. Meal planning    Eat a balanced diet that includes:  5 or more servings of fruits and  vegetables each day. At each meal, try to fill half of your plate with fruits and vegetables.  Up to 6-8 servings of whole grains each day.  Less than 6 oz of lean meat, poultry, or fish each day. A 3-oz serving of meat is about the same size as a deck of cards. One egg equals 1 oz.  2 servings of low-fat dairy each day.  A serving of nuts, seeds, or beans 5 times each week.  Heart-healthy fats. Healthy fats called Omega-3 fatty acids are found in foods such as flaxseeds and coldwater fish, like sardines, salmon, and mackerel.  Limit how much you eat of the following:  Canned or prepackaged foods.  Food that is high in trans fat, such as fried foods.  Food that is high in saturated fat, such as fatty meat.  Sweets, desserts, sugary drinks, and other foods with added sugar.  Full-fat dairy products.  Do not salt foods before eating.  Try to eat at least 2 vegetarian meals each week.  Eat more home-cooked food and less restaurant, buffet, and fast food.  When eating at a restaurant, ask that your food be prepared with less salt or no salt, if possible. What foods are recommended? The items listed may not be a complete list. Talk with your dietitian about what dietary choices are best for you. Grains  Whole-grain or whole-wheat bread. Whole-grain or whole-wheat pasta. Brown rice. [place]. Bulgur. Whole-grain and low-sodium cereals. Pita bread. Low-fat, low-sodium crackers. Whole-wheat flour tortillas. Vegetables  Fresh or frozen vegetables (raw, steamed, roasted, or grilled). Low-sodium or reduced-sodium tomato and vegetable juice. Low-sodium or reduced-sodium tomato sauce and tomato paste. Low-sodium or reduced-sodium canned vegetables. Fruits  All fresh, dried, or frozen fruit. Canned fruit in natural juice (without added sugar). Meat and other protein foods  Skinless chicken or [place].  Ground chicken or [place]. Pork with fat trimmed off. Fish and seafood. Egg whites.  Dried beans, peas, or lentils. Unsalted nuts, nut butters, and seeds. Unsalted canned beans. Lean cuts of beef with fat trimmed off. Low-sodium, lean deli meat. Dairy  Low-fat (1%) or fat-free (skim) milk. Fat-free, low-fat, or reduced-fat cheeses. Nonfat, low-sodium ricotta or cottage cheese. Low-fat or nonfat yogurt. Low-fat, low-sodium cheese. Fats and oils  Soft margarine without trans fats. Vegetable oil. Low-fat, reduced-fat, or light mayonnaise and salad dressings (reduced-sodium). Canola, safflower, olive, soybean, and sunflower oils. Avocado. Seasoning and other foods  Herbs. Spices. Seasoning mixes without salt. Unsalted popcorn and pretzels. Fat-free sweets. What foods are not recommended? The items listed may not be a complete list. Talk with your dietitian about what dietary choices are best for you. Grains  Baked goods made with fat, such as croissants, muffins, or some breads. Dry pasta or rice meal packs. Vegetables  Creamed or fried vegetables. Vegetables in a cheese sauce. Regular canned vegetables (not low-sodium or reduced-sodium). Regular canned tomato sauce and paste (not low-sodium or reduced-sodium). Regular tomato and vegetable juice (not low-sodium or reduced-sodium). Angie Fava. Olives. Fruits  Canned fruit in a light or heavy syrup. Fried fruit. Fruit in cream or butter sauce. Meat and other protein foods  Fatty cuts of meat. Ribs. Fried meat. Berniece Salines. Sausage. Bologna and other processed lunch meats. Salami. Fatback. Hotdogs. Bratwurst. Salted nuts and seeds. Canned beans with added salt. Canned or smoked fish. Whole eggs or egg yolks. Chicken or [place] with skin. Dairy  Whole or 2% milk, cream, and half-and-half. Whole or full-fat cream cheese. Whole-fat or sweetened yogurt. Full-fat cheese. Nondairy creamers. Whipped toppings. Processed cheese and cheese spreads. Fats and oils  Butter. Stick margarine. Lard. Shortening. Ghee. Bacon fat. Tropical oils, such as coconut,  palm kernel, or palm oil. Seasoning and other foods  Salted popcorn and pretzels. Onion salt, garlic salt, seasoned salt, table salt, and sea salt. Worcestershire sauce. Tartar sauce. Barbecue sauce. Teriyaki sauce. Soy sauce, including reduced-sodium. Steak sauce. Canned and packaged gravies. Fish sauce. Oyster sauce. Cocktail sauce. Horseradish that you find on the shelf. Ketchup. Mustard. Meat flavorings and tenderizers. Bouillon cubes. Hot sauce and Tabasco sauce. Premade or packaged marinades. Premade or packaged taco seasonings. Relishes. Regular salad dressings. Where to find more information:  National Heart, Lung, and [place]: https://wilson-eaton.com/  American Heart Association: www.heart.org Summary  The DASH eating plan is a healthy eating plan that has been shown to reduce high blood pressure (hypertension). It may also reduce your risk for type 2 diabetes, heart disease, and stroke.  With the DASH eating plan, you should limit salt (sodium) intake to 2,300 mg a day. If you have hypertension, you may need to reduce your sodium intake to 1,500 mg a day.  When on the DASH eating plan, aim to eat more fresh fruits and vegetables, whole grains, lean proteins, low-fat dairy, and heart-healthy fats.  Work with your health care provider or diet and nutrition specialist (dietitian) to adjust your eating plan to your individual calorie needs. This information is not intended to replace advice given to you by your health care provider. Make sure you discuss any questions you have with your health care provider. Document Released: 03/12/2011 Document Revised: 03/16/2016 Document Reviewed: 03/16/2016 Elsevier Interactive Patient Education  2017 [employer].

## 2016-08-24 ENCOUNTER — Ambulatory Visit: Payer: Managed Care, Other (non HMO)

## 2016-08-24 ENCOUNTER — Other Ambulatory Visit: Payer: Self-pay | Admitting: Family Medicine

## 2016-08-24 VITALS — BP 142/98 | HR 72

## 2016-08-24 DIAGNOSIS — I10 Essential (primary) hypertension: Secondary | ICD-10-CM

## 2016-08-24 MED ORDER — LISINOPRIL 10 MG PO TABS: 10.0000 mg | ORAL_TABLET | Freq: Every day | ORAL | 0 refills | Status: DC

## 2016-08-24 NOTE — Progress Notes (Signed)
Add lisinopril 10 mg daily; return in 2 weeks for BP recheck and BMP

## 2016-08-24 NOTE — Addendum Note (Signed)
Addended by: Docia Furl on: 08/24/2016 04:14 PM   Modules accepted: Orders

## 2016-10-26 ENCOUNTER — Other Ambulatory Visit: Payer: Self-pay | Admitting: Family Medicine

## 2016-10-26 MED ORDER — INDOMETHACIN 50 MG PO CAPS: 50.0000 mg | ORAL_CAPSULE | Freq: Three times a day (TID) | ORAL | 0 refills | Status: DC | PRN

## 2016-10-26 NOTE — Telephone Encounter (Signed)
Pt needs refill on Indomethacin to be sent to [facility].

## 2016-10-29 ENCOUNTER — Other Ambulatory Visit: Payer: Self-pay | Admitting: Family Medicine

## 2016-10-29 NOTE — Telephone Encounter (Signed)
See 08/24/16 clinical support note Patient was started on lisinopril at that time He was supposed to return 2 weeks later for BMP and BP recheck with CMA That was more than 2 months ago Please ask patient to come by for [facility] visit Find out how he's been taking his medicine This is not a PRN medicine and requires kidney testing I'm not approving the ACE-I until I see kidney function and we may need to switch to something else if compliance is an issue Thank you

## 2016-10-29 NOTE — Telephone Encounter (Signed)
Pt is requesting refill on Lisinopril. [facility] in [place].

## 2016-10-30 NOTE — Telephone Encounter (Signed)
Left detailed voicemail

## 2016-10-30 NOTE — Telephone Encounter (Signed)
Spoke with amber, patient states is still taking bp med but has ran out and has forgotten to take it on several occassions due to stress of job.  He has schedule bp check and labs for next Monday.

## 2016-11-02 ENCOUNTER — Ambulatory Visit (INDEPENDENT_AMBULATORY_CARE_PROVIDER_SITE_OTHER): Payer: PRIVATE HEALTH INSURANCE

## 2016-11-02 DIAGNOSIS — I10 Essential (primary) hypertension: Secondary | ICD-10-CM

## 2016-11-02 LAB — BASIC METABOLIC PANEL WITH GFR
BUN: 14 mg/dL (ref 7–25)
CHLORIDE: 105 mmol/L (ref 98–110)
CO2: 25 mmol/L (ref 20–31)
Calcium: 9.1 mg/dL (ref 8.6–10.3)
Creat: 1.29 mg/dL (ref 0.60–1.35)
GFR, EST NON AFRICAN AMERICAN: 69 mL/min (ref >=60)
GFR, Est African American: 80 mL/min (ref >=60)
GLUCOSE: 76 mg/dL (ref 65–99)
POTASSIUM: 4.3 mmol/L (ref 3.5–5.3)
Sodium: 139 mmol/L (ref 135–146)

## 2016-11-02 NOTE — Progress Notes (Signed)
Blood Pressure Check.  Reading was 122/80.

## 2016-11-03 ENCOUNTER — Other Ambulatory Visit: Payer: Self-pay | Admitting: Family Medicine

## 2016-11-03 MED ORDER — LISINOPRIL 10 MG PO TABS: 10.0000 mg | ORAL_TABLET | Freq: Every day | ORAL | 5 refills | Status: DC

## 2016-11-03 NOTE — Progress Notes (Signed)
Labs reviewed; okay; continue ACE-I

## 2016-11-08 IMAGING — CT CT ANGIO CHEST
2 of 6 series · 18 of 36 positions shown · IV contrast (APPLIED)
Comparison: Chest radiograph same date

CLINICAL DATA: Right lower chest pain and right upper quadrant
abdominal pain

EXAM:
CT ANGIOGRAPHY CHEST WITH CONTRAST
TECHNIQUE: Multidetector CT imaging of the chest was performed using the
standard protocol during bolus administration of intravenous
contrast. Multiplanar CT image reconstructions and MIPs were
obtained to evaluate the vascular anatomy.
CONTRAST:  100 cc Omnipaque 350 IV contrast

[Series 5: pe 1.0 thins · axial · 0.81mm/px · z∈[-378,-122]mm · 17 of 287 slices shown]
[im 16/287  lung]
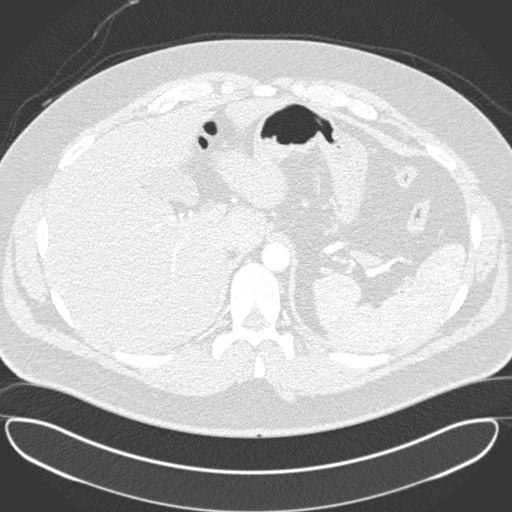
[im 32/287  mediastinal]
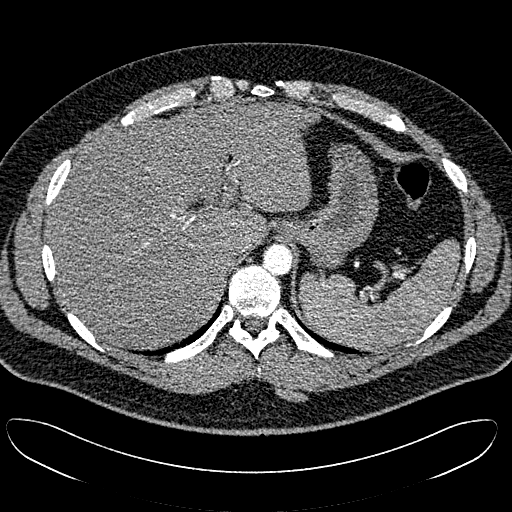
[im 48/287  lung]
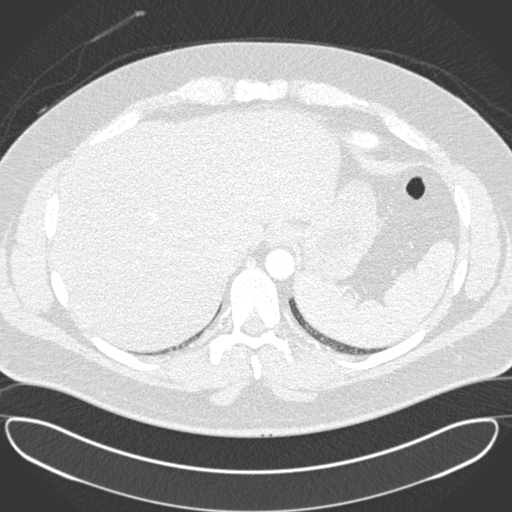
[im 64/287  mediastinal]
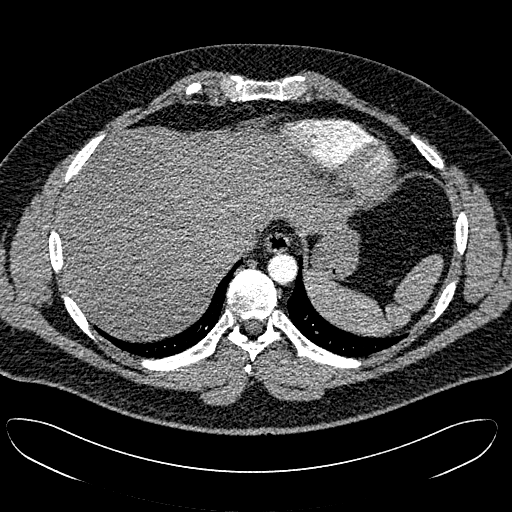
[im 80/287  lung]
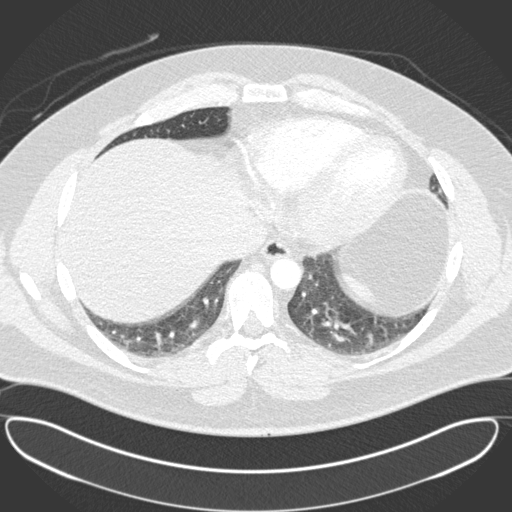
[im 96/287  mediastinal]
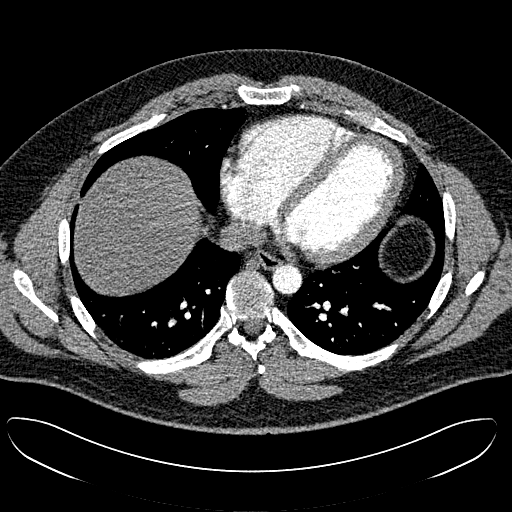
[im 112/287  lung]
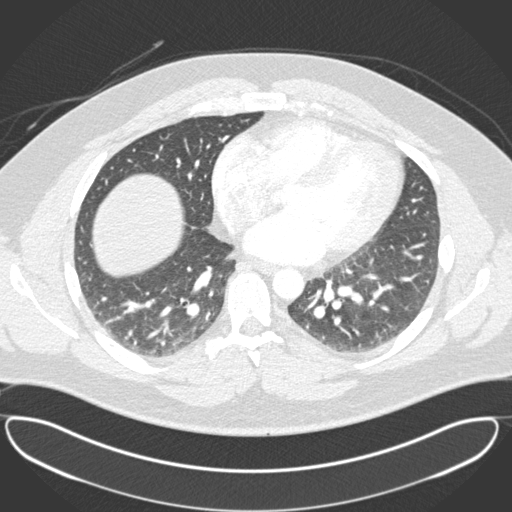
[im 128/287  mediastinal]
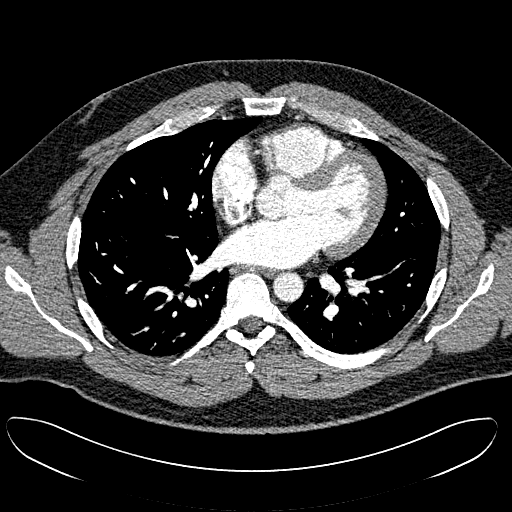
[im 144/287  lung]
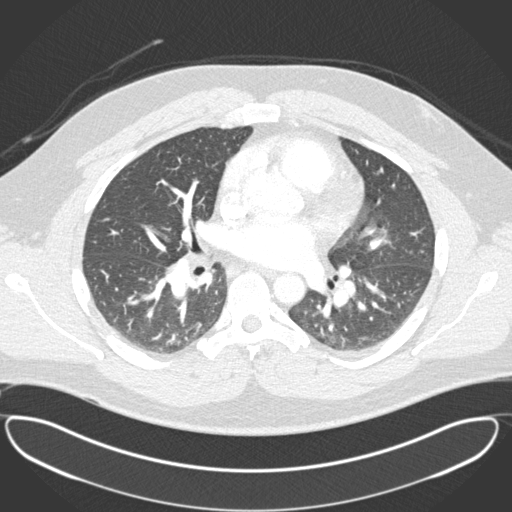
[im 159/287  mediastinal]
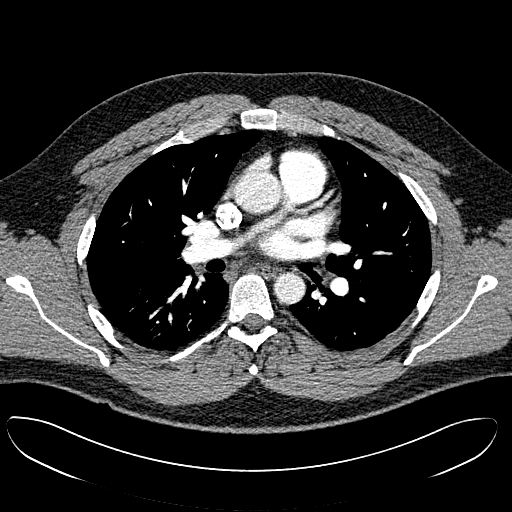
[im 175/287  lung]
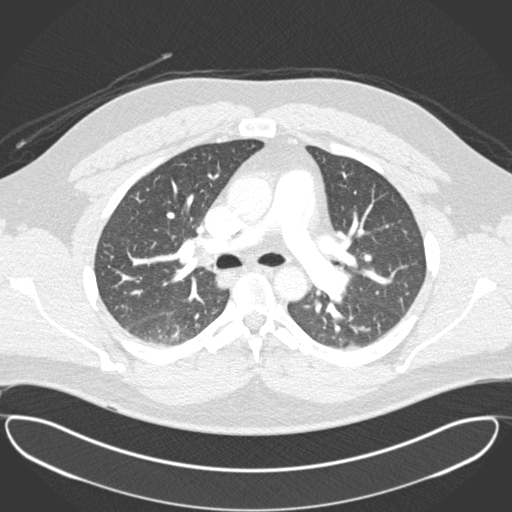
[im 191/287  mediastinal]
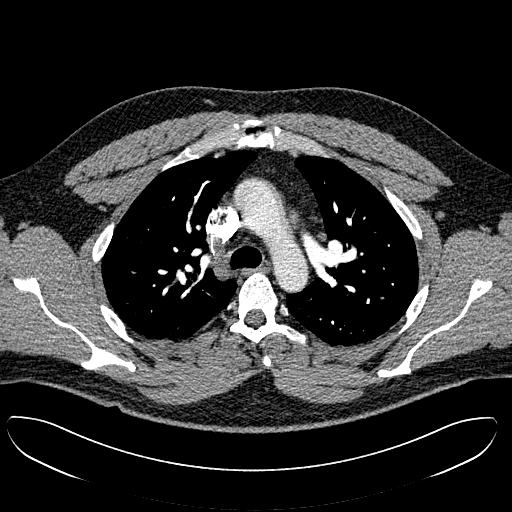
[im 207/287  lung]
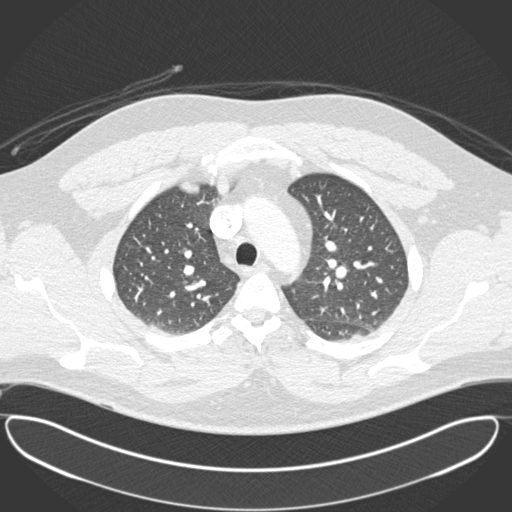
[im 223/287  mediastinal]
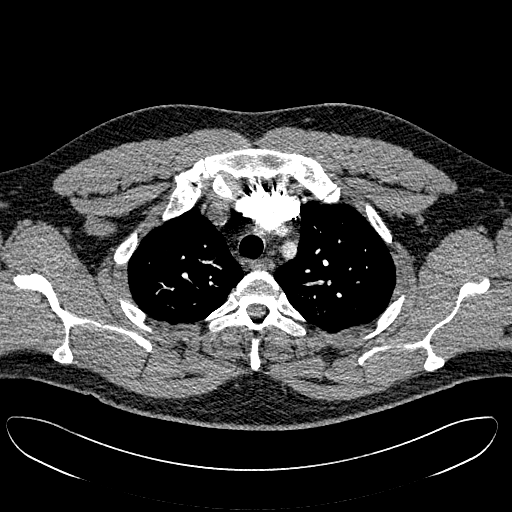
[im 239/287  lung]
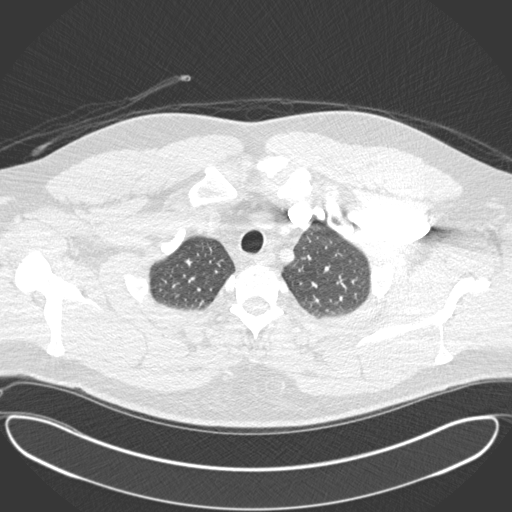
[im 255/287  mediastinal]
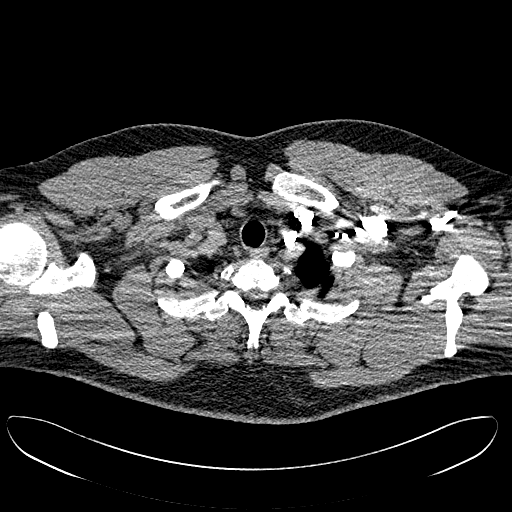
[im 271/287  lung]
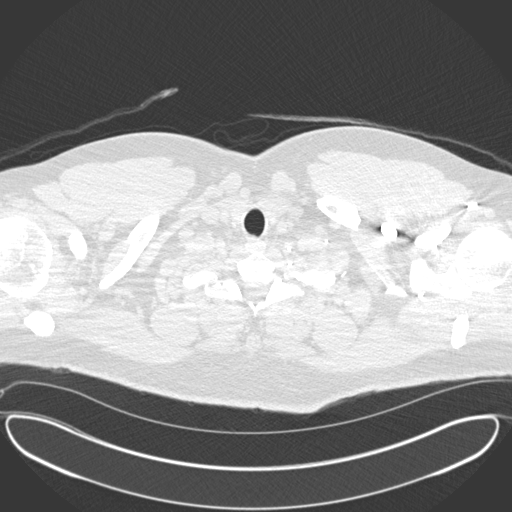

[Series 7: cor pe 2.0 mpr · coronal · 0.57mm/px · 1 of 137 slices shown]
[im 69/137  mediastinal]
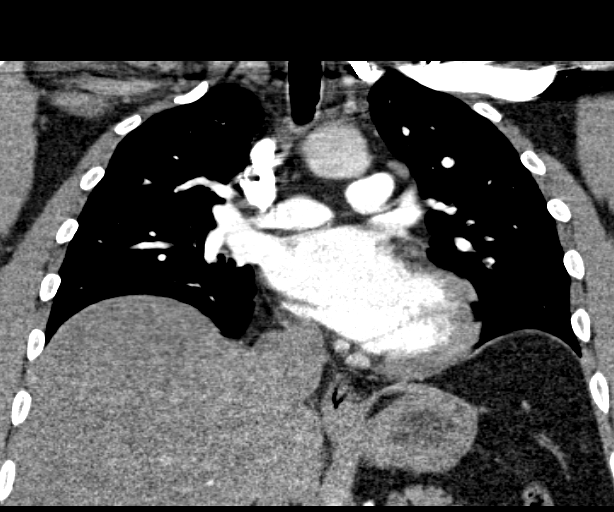

[18 of 36 positions shown; findings below may reference images not displayed]

FINDINGS: Due to early bolus timing, evaluation for acute pulmonary emboli
past the level of the third order pulmonary arteries is suboptimal.
No central focal filling defect is identified up to the level of the
third order pulmonary arteries to suggest acute pulmonary embolism.
Great vessels are normal in caliber. Heart size is normal. Trace
pericardial fluid.

No lymphadenopathy.  No pleural effusion.  Lungs are clear.

No acute osseous abnormality. Bilateral sternoclavicular joint
degenerative change.

Review of the MIP images confirms the above findings.
IMPRESSION: No acute cardiopulmonary process. Specifically, no evidence for
acute pulmonary embolism up to the level of the third order
pulmonary arteries.

## 2016-11-23 ENCOUNTER — Ambulatory Visit (INDEPENDENT_AMBULATORY_CARE_PROVIDER_SITE_OTHER): Payer: PRIVATE HEALTH INSURANCE | Admitting: Family Medicine

## 2016-11-23 ENCOUNTER — Encounter: Payer: Self-pay | Admitting: Family Medicine

## 2016-11-23 VITALS — BP 124/82 | HR 90 | Temp 98.1°F | Resp 16 | Wt 345.6 lb

## 2016-11-23 DIAGNOSIS — Z0289 Encounter for other administrative examinations: Secondary | ICD-10-CM

## 2016-11-23 DIAGNOSIS — I10 Essential (primary) hypertension: Secondary | ICD-10-CM

## 2016-11-23 DIAGNOSIS — E669 Obesity, unspecified: Secondary | ICD-10-CM

## 2016-11-23 NOTE — Patient Instructions (Signed)
Try to limit saturated fats in your diet (bologna, hot dogs, barbeque, cheeseburgers, hamburgers, steak, bacon, sausage, cheese, etc.) and get more fresh fruits, vegetables, and whole grains Keep up the amazing job with weight loss

## 2016-11-23 NOTE — Progress Notes (Signed)
BP 124/82   Pulse 90   Temp 98.1 F (36.7 C) (Oral)   Resp 16   Wt (!) 345 lb 9.6 oz (156.8 kg)   SpO2 96%   BMI 38.93 kg/m    Subjective:    Patient ID: Darren Robinson, male    DOB: [birth year], 41 y.o.   MRN: [id number]  HPI: Darren Robinson is a 41 y.o. male  Chief Complaint  Patient presents with  . Letter for School/Work    Forms    HPI Patient is here for [employer] forms Has to complete 50% [employer] No chest pain when running Has been working up to this Has lost 32 pounds since November; has really been trying No asthma or heart disease No chest pain when running at all He is doing well with diet and exercise He has changed what he is eating; not really keto diet; cut most carbs out; lots of vegetables; maybe too much protein, chicken breasts and steaks; hamburgers with cheese and that flushes out; staying hydrated; cut out soft drinks  Depression screen [facility] 2/9 11/23/2016 08/10/2016 02/20/2016 08/05/2015 02/21/2015  Decreased Interest 0 0 3 0 0  Down, Depressed, Hopeless 0 0 3 0 3  PHQ - 2 Score 0 0 6 0 3  Altered sleeping - - 3 - 3  Tired, decreased energy - - 1 - 3  Change in appetite - - 3 - 3  Feeling bad or failure about yourself  - - 0 - 0  Trouble concentrating - - 0 - 3  Moving slowly or fidgety/restless - - 0 - 0  Suicidal thoughts - - 0 - 0  PHQ-9 Score - - 13 - 15  Difficult doing work/chores - - Not difficult at all - Somewhat difficult    Relevant past medical, surgical, family and social history reviewed Past Medical History:  Diagnosis Date  . Essential hypertension, benign 02/11/2015  . Fatigue   . Gout   . Hx of gout 08/05/2015  . Hypertension   . Obesity 02/11/2015  . OSA (obstructive sleep apnea)   . Sleep apnea   . Vitamin D deficiency disease    Past Surgical History:  Procedure Laterality Date  . APPENDECTOMY     Family History  Problem Relation Age of Onset  . Alcohol abuse Mother   . Arthritis Mother   . Heart disease  Mother   . Mental illness Mother   . Thyroid disease Mother   . Diabetes Father   . Heart attack Father   . Kidney disease Father        On Dialysis  . Heart disease Father   . Hyperlipidemia Father   . Thyroid disease Sister   . Hyperlipidemia Sister   . Hypertension Sister   . Lung disease Maternal Grandfather   . Heart attack Paternal Grandmother   . Cancer Neg Hx   . Stroke Neg Hx    Social History   Social History  . Marital status: Married    Spouse name: N/A  . Number of children: N/A  . Years of education: N/A   Occupational History  . Not on file.   Social History Main Topics  . Smoking status: Former Smoker    Packs/day: 1.00    Years: 10.00    Types: Cigarettes    Quit date: 04/06/2004  . Smokeless tobacco: Current User    Types: Snuff, Chew  . Alcohol use 6.0 oz/week    10 Standard drinks or  equivalent per week     Comment: Social  . Drug use: No  . Sexual activity: Yes   Other Topics Concern  . Not on file   Social History Narrative  . No narrative on file    Interim medical history since last visit reviewed. Allergies and medications reviewed  Review of Systems Per HPI unless specifically indicated above     Objective:    BP 124/82   Pulse 90   Temp 98.1 F (36.7 C) (Oral)   Resp 16   Wt (!) 345 lb 9.6 oz (156.8 kg)   SpO2 96%   BMI 38.93 kg/m   Wt Readings from Last 3 Encounters:  11/23/16 (!) 345 lb 9.6 oz (156.8 kg)  08/10/16 (!) 370 lb 3.2 oz (167.9 kg)  02/20/16 (!) 377 lb 3 oz (171.1 kg)    Physical Exam  Constitutional: He appears well-developed and well-nourished. No distress.  Significant weight loss noted, acknowledged with patient  Eyes: No scleral icterus.  Cardiovascular: Normal rate and regular rhythm.   Pulmonary/Chest: Effort normal and breath sounds normal.  Skin: No pallor.  Psychiatric: He has a normal mood and affect. His behavior is normal.   Results for orders placed or performed in visit on 75/17/00    BASIC METABOLIC PANEL WITH GFR  Result Value Ref Range   Sodium 139 135 - 146 mmol/L   Potassium 4.3 3.5 - 5.3 mmol/L   Chloride 105 98 - 110 mmol/L   CO2 25 20 - 31 mmol/L   Glucose, Bld 76 65 - 99 mg/dL   BUN 14 7 - 25 mg/dL   Creat 1.29 0.60 - 1.35 mg/dL   Calcium 9.1 8.6 - 10.3 mg/dL   GFR, Est African American 80 >=60 mL/min   GFR, Est Non African American 69 >=60 mL/min      Assessment & Plan:   Problem List Items Addressed This Visit      Cardiovascular and Mediastinum   Essential hypertension, benign    Controlled; continue current medicine; last BMP reviewed; praise given for weight loss and healthier eating        Other   Obesity (BMI 35.0-39.9 without comorbidity)    Praise given for healthier eating, weight loss, activity       Other Visit Diagnoses    Encounter for completion of form with patient    -  Primary   form completed       Follow up plan: No Follow-up on file.  An after-visit summary was printed and given to the patient at [place].  Please see the patient instructions which may contain other information and recommendations beyond what is mentioned above in the assessment and plan.  No orders of the defined types were placed in this encounter.   No orders of the defined types were placed in this encounter.

## 2016-11-25 ENCOUNTER — Encounter: Payer: Self-pay | Admitting: Family Medicine

## 2016-11-25 DIAGNOSIS — E669 Obesity, unspecified: Secondary | ICD-10-CM | POA: Insufficient documentation

## 2016-11-25 NOTE — Assessment & Plan Note (Signed)
Controlled; continue current medicine; last BMP reviewed; praise given for weight loss and healthier eating

## 2016-11-25 NOTE — Assessment & Plan Note (Signed)
Praise given for healthier eating, weight loss, activity

## 2016-12-08 ENCOUNTER — Other Ambulatory Visit: Payer: Self-pay | Admitting: Family Medicine

## 2017-01-23 ENCOUNTER — Other Ambulatory Visit: Payer: Self-pay | Admitting: Family Medicine

## 2017-01-25 NOTE — Telephone Encounter (Signed)
Your patient 

## 2017-03-19 ENCOUNTER — Other Ambulatory Visit: Payer: Self-pay

## 2017-03-19 ENCOUNTER — Encounter
Admission: RE | Admit: 2017-03-19 | Discharge: 2017-03-19 | Disposition: A | Discharge status: Home / Self Care | Payer: PRIVATE HEALTH INSURANCE | Source: Ambulatory Visit | Attending: Unknown Physician Specialty | Admitting: Unknown Physician Specialty

## 2017-03-19 ENCOUNTER — Inpatient Hospital Stay: Admission: RE | Admit: 2017-03-19 | Payer: Managed Care, Other (non HMO) | Source: Ambulatory Visit

## 2017-03-19 NOTE — Patient Instructions (Addendum)
  Your procedure is scheduled ZD:[id number] Dec.18th , 2018. Report to Same Day Surgery. To find out your arrival time please call [phone] between 1PM - 3PM on Monday Dec. 17th, 2018 .  Remember: Instructions that are not followed completely may result in serious medical risk, up to and including death, or upon the discretion of your surgeon and anesthesiologist your surgery may need to be rescheduled.    _x___ 1. Do not eat food after midnight night prior to surgery. No gum   chewing or hard candies, snacks or breakfast.    09-21-2022 drink the following: water, Gatorade, clear apple juice, black coffee     or black tea up until 2 hours prior to ARRIVAL time.     __x__ 2. No Alcohol for 24 hours before or after surgery.   ____ 3. Bring all medications with you on the day of surgery if instructed.    __x__ 4. Notify your doctor if there is any change in your medical condition     (cold, fever, infections).    __x___ 5.   Do Not Smoke or use e-cigarettes For 24 Hours Prior to Your   Surgery.  Do not use any chewable tobacco products for at least 6   hours prior to  surgery.                      Do not wear jewelry, make-up, hairpins, clips or nail polish.  Do not wear lotions, powders, or perfumes.   Do not shave 48 hours prior to surgery. Men may shave face and neck.  Do not bring valuables to the hospital.    [facility] is not responsible for any belongings or valuables.               Contacts, dentures or bridgework may not be worn into surgery.  Leave your suitcase in the car. After surgery it may be brought to your room.  For patients admitted to the hospital, discharge time is determined by your  treatment team.   Patients discharged the day of surgery will not be allowed to drive home.    Please read over the following fact sheets that you were given:   [facility] Preparing for Surgery  __x__ Take these medicines the morning of surgery with A SIP OF WATER:    1. sertraline  (ZOLOFT)       ____ Fleet Enema (as directed)   ____ Use CHG Soap as directed on instruction sheet  ____ Use inhalers on the day of surgery and bring to hospital day of surgery  ____ Stop metformin 2 days prior to surgery    ____ Take 1/2 of usual insulin dose the night before surgery and none on the morning of          surgery.   ____ Stop Eliquis/Coumadin/Plavix/aspirin on does not apply.  __x__ Stop Anti-inflammatories such as Advil, Aleve, Ibuprofen, Motrin, Naproxen, Naprosyn, Goodies powders or aspirin products. OK to take Tylenol.   ____ Stop supplements until after surgery.    ____ Bring C-Pap to the hospital.

## 2017-03-20 NOTE — Pre-Procedure Instructions (Signed)
Faxed request for orders and H&P to Dr. Ileene Hutchinson office.

## 2017-03-22 ENCOUNTER — Encounter
Admission: RE | Admit: 2017-03-22 | Discharge: 2017-03-22 | Disposition: A | Discharge status: Home / Self Care | Payer: PRIVATE HEALTH INSURANCE | Source: Ambulatory Visit | Attending: Unknown Physician Specialty | Admitting: Unknown Physician Specialty

## 2017-03-22 DIAGNOSIS — G4733 Obstructive sleep apnea (adult) (pediatric): Secondary | ICD-10-CM | POA: Diagnosis not present

## 2017-03-22 DIAGNOSIS — Z833 Family history of diabetes mellitus: Secondary | ICD-10-CM | POA: Diagnosis not present

## 2017-03-22 DIAGNOSIS — J343 Hypertrophy of nasal turbinates: Secondary | ICD-10-CM | POA: Diagnosis not present

## 2017-03-22 DIAGNOSIS — Z79899 Other long term (current) drug therapy: Secondary | ICD-10-CM | POA: Diagnosis not present

## 2017-03-22 DIAGNOSIS — J342 Deviated nasal septum: Secondary | ICD-10-CM | POA: Diagnosis not present

## 2017-03-22 DIAGNOSIS — E669 Obesity, unspecified: Secondary | ICD-10-CM | POA: Diagnosis not present

## 2017-03-22 DIAGNOSIS — Z8249 Family history of ischemic heart disease and other diseases of the circulatory system: Secondary | ICD-10-CM | POA: Diagnosis not present

## 2017-03-22 DIAGNOSIS — I10 Essential (primary) hypertension: Secondary | ICD-10-CM | POA: Diagnosis not present

## 2017-03-22 DIAGNOSIS — Z87891 Personal history of nicotine dependence: Secondary | ICD-10-CM | POA: Diagnosis not present

## 2017-03-22 DIAGNOSIS — J3489 Other specified disorders of nose and nasal sinuses: Secondary | ICD-10-CM | POA: Diagnosis present

## 2017-03-22 DIAGNOSIS — Z6836 Body mass index (BMI) 36.0-36.9, adult: Secondary | ICD-10-CM | POA: Diagnosis not present

## 2017-03-22 DIAGNOSIS — Z9049 Acquired absence of other specified parts of digestive tract: Secondary | ICD-10-CM | POA: Diagnosis not present

## 2017-03-22 DIAGNOSIS — Z7951 Long term (current) use of inhaled steroids: Secondary | ICD-10-CM | POA: Diagnosis not present

## 2017-03-22 MED ORDER — LIDOCAINE HCL 4 % EX SOLN
Freq: Once | CUTANEOUS | Status: DC
  Filled 2017-03-22: qty 10

## 2017-03-23 ENCOUNTER — Encounter: Payer: Self-pay | Admitting: *Deleted

## 2017-03-23 ENCOUNTER — Ambulatory Visit: Payer: PRIVATE HEALTH INSURANCE | Admitting: Registered Nurse

## 2017-03-23 ENCOUNTER — Encounter
Admission: RE | Disposition: A | Discharge status: Home / Self Care | Payer: Self-pay | Source: Ambulatory Visit | Attending: Unknown Physician Specialty

## 2017-03-23 ENCOUNTER — Ambulatory Visit
Admission: RE | Admit: 2017-03-23 | Discharge: 2017-03-23 | Disposition: A | Discharge status: Home / Self Care | Payer: PRIVATE HEALTH INSURANCE | Source: Ambulatory Visit | Attending: Unknown Physician Specialty | Admitting: Unknown Physician Specialty

## 2017-03-23 ENCOUNTER — Other Ambulatory Visit: Payer: Self-pay

## 2017-03-23 DIAGNOSIS — J343 Hypertrophy of nasal turbinates: Secondary | ICD-10-CM | POA: Insufficient documentation

## 2017-03-23 DIAGNOSIS — I10 Essential (primary) hypertension: Secondary | ICD-10-CM | POA: Insufficient documentation

## 2017-03-23 DIAGNOSIS — J3489 Other specified disorders of nose and nasal sinuses: Secondary | ICD-10-CM | POA: Insufficient documentation

## 2017-03-23 DIAGNOSIS — Z9049 Acquired absence of other specified parts of digestive tract: Secondary | ICD-10-CM | POA: Insufficient documentation

## 2017-03-23 DIAGNOSIS — Z79899 Other long term (current) drug therapy: Secondary | ICD-10-CM | POA: Insufficient documentation

## 2017-03-23 DIAGNOSIS — J342 Deviated nasal septum: Secondary | ICD-10-CM | POA: Insufficient documentation

## 2017-03-23 DIAGNOSIS — Z7951 Long term (current) use of inhaled steroids: Secondary | ICD-10-CM | POA: Insufficient documentation

## 2017-03-23 DIAGNOSIS — Z8249 Family history of ischemic heart disease and other diseases of the circulatory system: Secondary | ICD-10-CM | POA: Insufficient documentation

## 2017-03-23 DIAGNOSIS — E669 Obesity, unspecified: Secondary | ICD-10-CM | POA: Insufficient documentation

## 2017-03-23 DIAGNOSIS — Z6836 Body mass index (BMI) 36.0-36.9, adult: Secondary | ICD-10-CM | POA: Insufficient documentation

## 2017-03-23 DIAGNOSIS — Z87891 Personal history of nicotine dependence: Secondary | ICD-10-CM | POA: Insufficient documentation

## 2017-03-23 DIAGNOSIS — G4733 Obstructive sleep apnea (adult) (pediatric): Secondary | ICD-10-CM | POA: Insufficient documentation

## 2017-03-23 DIAGNOSIS — Z833 Family history of diabetes mellitus: Secondary | ICD-10-CM | POA: Insufficient documentation

## 2017-03-23 HISTORY — PX: NASAL SEPTOPLASTY W/ TURBINOPLASTY: SHX2070

## 2017-03-23 SURGERY — SEPTOPLASTY, NOSE, WITH NASAL TURBINATE REDUCTION
Anesthesia: General | Laterality: Bilateral

## 2017-03-23 MED ORDER — OXYCODONE HCL 5 MG PO TABS: 5.0000 mg | ORAL_TABLET | Freq: Once | ORAL | Status: DC

## 2017-03-23 MED ORDER — EPHEDRINE SULFATE 50 MG/ML IJ SOLN
INTRAMUSCULAR | Status: AC
  Filled 2017-03-23: qty 1

## 2017-03-23 MED ORDER — SEVOFLURANE IN SOLN
RESPIRATORY_TRACT | Status: AC
  Filled 2017-03-23: qty 250

## 2017-03-23 MED ORDER — PROPOFOL 10 MG/ML IV BOLUS
INTRAVENOUS | Status: DC | PRN
  Administered 2017-03-23: 50 mg via INTRAVENOUS
  Administered 2017-03-23: 200 mg via INTRAVENOUS

## 2017-03-23 MED ORDER — ONDANSETRON HCL 4 MG/2ML IJ SOLN
INTRAMUSCULAR | Status: AC
  Filled 2017-03-23: qty 2

## 2017-03-23 MED ORDER — PROPOFOL 10 MG/ML IV BOLUS
INTRAVENOUS | Status: AC
  Filled 2017-03-23: qty 20

## 2017-03-23 MED ORDER — MIDAZOLAM HCL 2 MG/2ML IJ SOLN
INTRAMUSCULAR | Status: DC | PRN
Start: 2017-03-23 — End: 2017-03-23
  Administered 2017-03-23: 2 mg via INTRAVENOUS

## 2017-03-23 MED ORDER — LACTATED RINGERS IV SOLN
INTRAVENOUS | Status: DC
  Administered 2017-03-23 (×2): via INTRAVENOUS

## 2017-03-23 MED ORDER — ROCURONIUM BROMIDE 100 MG/10ML IV SOLN
INTRAVENOUS | Status: DC | PRN
  Administered 2017-03-23: 50 mg via INTRAVENOUS

## 2017-03-23 MED ORDER — FENTANYL CITRATE (PF) 100 MCG/2ML IJ SOLN
INTRAMUSCULAR | Status: DC | PRN
  Administered 2017-03-23 (×2): 50 ug via INTRAVENOUS

## 2017-03-23 MED ORDER — FAMOTIDINE 20 MG PO TABS
ORAL_TABLET | ORAL | Status: AC
  Filled 2017-03-23: qty 1

## 2017-03-23 MED ORDER — MEPERIDINE HCL 50 MG/ML IJ SOLN: 6.2500 mg | INTRAMUSCULAR | Status: DC | PRN

## 2017-03-23 MED ORDER — BACITRACIN ZINC 500 UNIT/GM EX OINT
TOPICAL_OINTMENT | CUTANEOUS | Status: AC
  Filled 2017-03-23: qty 28.35

## 2017-03-23 MED ORDER — OXYMETAZOLINE HCL 0.05 % NA SOLN
1.0000 | Freq: Once | NASAL | Status: AC
  Administered 2017-03-23: 6 via NASAL

## 2017-03-23 MED ORDER — SUGAMMADEX SODIUM 200 MG/2ML IV SOLN
INTRAVENOUS | Status: DC | PRN
  Administered 2017-03-23: 300 mg via INTRAVENOUS

## 2017-03-23 MED ORDER — OXYCODONE HCL 5 MG PO TABS
ORAL_TABLET | ORAL | Status: AC
  Filled 2017-03-23: qty 1

## 2017-03-23 MED ORDER — LABETALOL HCL 5 MG/ML IV SOLN
INTRAVENOUS | Status: AC
  Administered 2017-03-23: 5 mg via INTRAVENOUS
  Filled 2017-03-23: qty 4

## 2017-03-23 MED ORDER — LIDOCAINE-EPINEPHRINE 1 %-1:100000 IJ SOLN
INTRAMUSCULAR | Status: DC | PRN
  Administered 2017-03-23: 12 mL

## 2017-03-23 MED ORDER — ONDANSETRON HCL 4 MG/2ML IJ SOLN
INTRAMUSCULAR | Status: DC | PRN
  Administered 2017-03-23: 4 mg via INTRAVENOUS

## 2017-03-23 MED ORDER — FENTANYL CITRATE (PF) 100 MCG/2ML IJ SOLN
INTRAMUSCULAR | Status: AC
  Filled 2017-03-23: qty 2

## 2017-03-23 MED ORDER — LIDOCAINE HCL (PF) 2 % IJ SOLN
INTRAMUSCULAR | Status: AC
Start: 2017-03-23 — End: 2017-03-23
  Filled 2017-03-23: qty 20

## 2017-03-23 MED ORDER — OXYCODONE HCL 5 MG PO TABS
5.0000 mg | ORAL_TABLET | Freq: Once | ORAL | Status: AC | PRN
  Administered 2017-03-23 (×2): 5 mg via ORAL

## 2017-03-23 MED ORDER — MIDAZOLAM HCL 2 MG/2ML IJ SOLN
INTRAMUSCULAR | Status: AC
  Filled 2017-03-23: qty 2

## 2017-03-23 MED ORDER — OXYCODONE-ACETAMINOPHEN 5-325 MG PO TABS: 1.0000 | ORAL_TABLET | ORAL | 0 refills | Status: DC | PRN

## 2017-03-23 MED ORDER — OXYCODONE HCL 5 MG/5ML PO SOLN: 5.0000 mg | Freq: Once | ORAL | Status: AC | PRN

## 2017-03-23 MED ORDER — LABETALOL HCL 5 MG/ML IV SOLN
5.0000 mg | INTRAVENOUS | Status: DC | PRN
  Administered 2017-03-23 (×2): 5 mg via INTRAVENOUS
  Filled 2017-03-23 (×3): qty 4

## 2017-03-23 MED ORDER — FAMOTIDINE 20 MG PO TABS
20.0000 mg | ORAL_TABLET | Freq: Once | ORAL | Status: AC
  Administered 2017-03-23: 20 mg via ORAL

## 2017-03-23 MED ORDER — SUCCINYLCHOLINE CHLORIDE 20 MG/ML IJ SOLN
INTRAMUSCULAR | Status: AC
  Filled 2017-03-23: qty 1

## 2017-03-23 MED ORDER — PROMETHAZINE HCL 25 MG/ML IJ SOLN: 6.2500 mg | INTRAMUSCULAR | Status: DC | PRN

## 2017-03-23 MED ORDER — OXYMETAZOLINE HCL 0.05 % NA SOLN
NASAL | Status: AC
  Filled 2017-03-23: qty 15

## 2017-03-23 MED ORDER — DEXAMETHASONE SODIUM PHOSPHATE 10 MG/ML IJ SOLN
INTRAMUSCULAR | Status: AC
  Filled 2017-03-23: qty 1

## 2017-03-23 MED ORDER — LIDOCAINE HCL (CARDIAC) 20 MG/ML IV SOLN
INTRAVENOUS | Status: DC | PRN
  Administered 2017-03-23: 100 mg via INTRAVENOUS

## 2017-03-23 MED ORDER — LISINOPRIL 10 MG PO TABS
10.0000 mg | ORAL_TABLET | Freq: Once | ORAL | Status: AC
  Administered 2017-03-23: 10 mg via ORAL
  Filled 2017-03-23 (×2): qty 1

## 2017-03-23 MED ORDER — OXYCODONE HCL 5 MG PO TABS
ORAL_TABLET | ORAL | Status: AC
  Administered 2017-03-23: 5 mg via ORAL
  Filled 2017-03-23: qty 1

## 2017-03-23 MED ORDER — FENTANYL CITRATE (PF) 100 MCG/2ML IJ SOLN: 25.0000 ug | INTRAMUSCULAR | Status: DC | PRN

## 2017-03-23 MED ORDER — DEXAMETHASONE SODIUM PHOSPHATE 10 MG/ML IJ SOLN
INTRAMUSCULAR | Status: DC | PRN
  Administered 2017-03-23: 10 mg via INTRAVENOUS

## 2017-03-23 MED ORDER — SULFAMETHOXAZOLE-TRIMETHOPRIM 800-160 MG PO TABS: 1.0000 | ORAL_TABLET | Freq: Two times a day (BID) | ORAL | 0 refills | Status: DC

## 2017-03-23 MED ORDER — LIDOCAINE-EPINEPHRINE 1 %-1:100000 IJ SOLN
INTRAMUSCULAR | Status: AC
  Filled 2017-03-23: qty 1

## 2017-03-23 MED ORDER — ROCURONIUM BROMIDE 50 MG/5ML IV SOLN
INTRAVENOUS | Status: AC
  Filled 2017-03-23: qty 1

## 2017-03-23 SURGICAL SUPPLY — 25 items
BANDAGE EYE OVAL (MISCELLANEOUS) IMPLANT
BLADE SURG 15 STRL LF DISP TIS (BLADE) ×1 IMPLANT
BLADE SURG 15 STRL SS (BLADE) ×2
CANISTER SUCT 1200ML W/VALVE (MISCELLANEOUS) ×3 IMPLANT
COAG SUCT 10F 3.5MM HAND CTRL (MISCELLANEOUS) ×3 IMPLANT
DRESSING NASL FOAM PST OP SINU (MISCELLANEOUS) ×2 IMPLANT
DRSG NASAL FOAM POST OP SINU (MISCELLANEOUS) ×6
ELECT REM PT RETURN 9FT ADLT (ELECTROSURGICAL) ×3
ELECTRODE REM PT RTRN 9FT ADLT (ELECTROSURGICAL) ×1 IMPLANT
GLOVE BIO SURGEON STRL SZ7.5 (GLOVE) ×6 IMPLANT
GOWN STRL REUS W/ TWL LRG LVL3 (GOWN DISPOSABLE) ×2 IMPLANT
GOWN STRL REUS W/TWL LRG LVL3 (GOWN DISPOSABLE) ×4
LABEL OR SOLS (LABEL) IMPLANT
NS IRRIG 500ML POUR BTL (IV SOLUTION) ×3 IMPLANT
PACK HEAD/NECK (MISCELLANEOUS) ×3 IMPLANT
SPLINT NASAL REUTER .5MM (MISCELLANEOUS) ×3 IMPLANT
SPOGE SURGIFLO 8M (HEMOSTASIS)
SPONGE NEURO XRAY DETECT 1X3 (DISPOSABLE) ×3 IMPLANT
SPONGE SURGIFLO 8M (HEMOSTASIS) IMPLANT
SUT CHROMIC 3-0 (SUTURE) ×2
SUT CHROMIC 3-0 KS 27XMFL CR (SUTURE) ×1
SUT ETHILON 3-0 KS 30 BLK (SUTURE) IMPLANT
SUT PLAIN GUT 4-0 (SUTURE) ×3 IMPLANT
SUTURE CHRMC 3-0 KS 27XMFL CR (SUTURE) ×1 IMPLANT
WATER STERILE IRR 1000ML POUR (IV SOLUTION) ×3 IMPLANT

## 2017-03-23 NOTE — Anesthesia Procedure Notes (Signed)
Procedure Name: Intubation Date/Time: 03/23/2017 9:52 AM Performed by: Johnna Acosta, CRNA Pre-anesthesia Checklist: Patient identified, Emergency Drugs available, Suction available, Patient being monitored and Timeout performed Patient Re-evaluated:Patient Re-evaluated prior to induction Oxygen Delivery Method: Circle system utilized Preoxygenation: Pre-oxygenation with 100% oxygen Induction Type: IV induction Ventilation: Mask ventilation with difficulty, Oral airway inserted - appropriate to patient size and Two handed mask ventilation required Laryngoscope Size: Sabra Heck, 2, McGraph and 4 Grade View: Grade I Tube type: Oral Tube size: 7.5 mm Number of attempts: 2 Airway Equipment and Method: Stylet Placement Confirmation: ETT inserted through vocal cords under direct vision,  positive ETCO2 and breath sounds checked- equal and bilateral Secured at: 24 cm Tube secured with: Tape Dental Injury: Teeth and Oropharynx as per pre-operative assessment  Difficulty Due To: Difficulty was unanticipated, Difficult Airway- due to large tongue and Difficult Airway- due to anterior larynx

## 2017-03-23 NOTE — Anesthesia Postprocedure Evaluation (Signed)
Anesthesia Post Note  Patient: Darren Robinson  Procedure(s) Performed: NASAL SEPTOPLASTY WITH TURBINATE REDUCTION (Bilateral )  Patient location during evaluation: PACU Anesthesia Type: General Level of consciousness: awake and alert and oriented Pain management: pain level controlled Vital Signs Assessment: post-procedure vital signs reviewed and stable Respiratory status: spontaneous breathing, nonlabored ventilation and respiratory function stable Cardiovascular status: blood pressure returned to baseline and stable Postop Assessment: no signs of nausea or vomiting Anesthetic complications: no     Last Vitals:  Vitals:   03/23/17 1104 03/23/17 1126  BP: (!) 162/111 (!) 162/114  Pulse: 67 (!) 54  Resp: 13 10  Temp:    SpO2: 99% 100%    Last Pain:  Vitals:   03/23/17 1049  TempSrc: Temporal                 Valyn Latchford

## 2017-03-23 NOTE — H&P (Signed)
The patient's history has been reviewed, patient examined, no change in status, stable for surgery.  Questions were answered to the patients satisfaction.  

## 2017-03-23 NOTE — Anesthesia Preprocedure Evaluation (Signed)
Anesthesia Evaluation  Patient identified by MRN, date of birth, ID band Patient awake    Reviewed: Allergy & Precautions, NPO status , Patient's Chart, lab work & pertinent test results  History of Anesthesia Complications Negative for: history of anesthetic complications  Airway Mallampati: II  TM Distance: >3 FB Neck ROM: Full    Dental no notable dental hx.    Pulmonary sleep apnea and Continuous Positive Airway Pressure Ventilation , neg COPD, former smoker,    breath sounds clear to auscultation- rhonchi (-) wheezing      Cardiovascular Exercise Tolerance: Good hypertension, Pt. on medications (-) CAD, (-) Past MI and (-) Cardiac Stents  Rhythm:Regular Rate:Normal - Systolic murmurs and - Diastolic murmurs    Neuro/Psych negative neurological ROS  negative psych ROS   GI/Hepatic negative GI ROS, Neg liver ROS,   Endo/Other  negative endocrine ROSneg diabetes  Renal/GU negative Renal ROS     Musculoskeletal  (+) Arthritis ,   Abdominal (+) + obese,   Peds  Hematology negative hematology ROS (+)   Anesthesia Other Findings Past Medical History: 02/11/2015: Essential hypertension, benign No date: Fatigue No date: Gout 08/05/2015: Hx of gout No date: Hypertension 02/11/2015: Obesity No date: OSA (obstructive sleep apnea) No date: Sleep apnea No date: Vitamin D deficiency disease   Reproductive/Obstetrics                             Anesthesia Physical Anesthesia Plan  ASA: II  Anesthesia Plan: General   Post-op Pain Management:    Induction: Intravenous  PONV Risk Score and Plan: 1 and Dexamethasone and Ondansetron  Airway Management Planned: Oral ETT  Additional Equipment:   Intra-op Plan:   Post-operative Plan: Extubation in OR  Informed Consent: I have reviewed the patients History and Physical, chart, labs and discussed the procedure including the risks, benefits  and alternatives for the proposed anesthesia with the patient or authorized representative who has indicated his/her understanding and acceptance.   Dental advisory given  Plan Discussed with: CRNA and Anesthesiologist  Anesthesia Plan Comments:         Anesthesia Quick Evaluation

## 2017-03-23 NOTE — Transfer of Care (Signed)
Immediate Anesthesia Transfer of Care Note  Patient: Darren Robinson  Procedure(s) Performed: Procedure(s): NASAL SEPTOPLASTY WITH TURBINATE REDUCTION (Bilateral)  Patient Location: PACU  Anesthesia Type:General  Level of Consciousness: sedated  Airway & Oxygen Therapy: Patient Spontanous Breathing and Patient connected to face mask oxygen  Post-op Assessment: Report given to RN and Post -op Vital signs reviewed and stable  Post vital signs: Reviewed and stable  Last Vitals:  Vitals:   03/23/17 0736 03/23/17 1049  BP: 133/85 (!) 157/109  Pulse: (!) 58 62  Resp: 14 12  Temp: 36.8 C (P) 36.5 C  SpO2: 84% 037%    Complications: No apparent anesthesia complications

## 2017-03-23 NOTE — Anesthesia Post-op Follow-up Note (Signed)
Anesthesia QCDR form completed.        

## 2017-03-23 NOTE — Discharge Instructions (Signed)
Septoplasty, Care After Refer to this sheet in the next few weeks. These instructions provide you with information about caring for yourself after your procedure. Your health care provider may also give you more specific instructions. Your treatment has been planned according to current medical practices, but problems sometimes occur. Call your health care provider if you have any problems or questions after your procedure. What can I expect after the procedure? After your procedure, it is typical to have the following:  Mild headache.  Stuffy nose.  Feeling of fullness in your ears.  Bloody fluid coming from your nose.  Follow these instructions at home: Medicines  If you were prescribed an antibiotic medicine, finish it all even if you start to feel better.  Take medicines only as directed by your health care provider.  You may be directed to clean your nostrils with a saline nasal spray. This will help to clear the crusts and blood clots in your nose. The solution can be found as an over-the-counter solution or made at home as directed by your health care provider.  You may need to take laxatives or stool softeners as directed by your health care provider. What to Avoid  Do not blow your nose for 2 weeks after surgery or as directed by your health care provider.  Do not lift anything heavier than 10 lb (4.5 kg) for 2 weeks or as directed by your health care provider.  Avoid strenuous activities that can cause nosebleeds for 2 weeks. These include activities such as running or playing sports.  Avoid very hot or steamy showers for several days after surgery or as directed by your health care provider. Eating and drinking  Avoid eating hot and spicy foods for several days after surgery or as directed by your health care provider.  Eat plenty of fiber to keep your stools soft, especially while you are taking pain medicine. This helps you to avoid straining, which can cause a  nosebleed.  Drink enough fluid to keep your urine clear or pale yellow. General instructions  Raise (elevate) your head while you are lying down.  Keep all follow-up visits as directed by your health care provider. This is important.  If you have nasal splints, follow your health care provider's instructions about removal.  If your nose was packed with gauze, follow your health care provider's instructions about removal. Contact a health care provider if:  You develop swelling or increased pain in your nose.  You have yellowish-white fluid (pus) coming from your nose.  You have severe diarrhea.  You have ongoing (persistent) nausea.  You cannot breathe through your nose.  You have a fever. Get help right away if:  You are short of breath.  You feel dizzy or you faint.  You have vision changes.  You are bleeding heavily from your nose.  You are vomiting.  You have a severe headache or a stiff neck. This information is not intended to replace advice given to you by your health care provider. Make sure you discuss any questions you have with your health care provider. Document Released: 03/23/2005 Document Revised: 11/16/2015 Document Reviewed: 11/01/2013 Elsevier Interactive Patient Education  [employer].

## 2017-03-23 NOTE — Op Note (Signed)
PREOPERATIVE DIAGNOSIS:  Chronic nasal obstruction.  POSTOPERATIVE DIAGNOSIS:  Chronic nasal obstruction.  SURGEON:  Roena Malady, M.D.  NAME OF PROCEDURE:  1. Nasal septoplasty. 2. Submucous resection of inferior turbinates.  OPERATIVE FINDINGS:  Severe nasal septal deformity, hypertrophy of the inferior turbinates.   DESCRIPTION OF THE PROCEDURE:  AMIN FORNWALT was identified in the holding area and taken to the operating room and placed in the supine position.  After general endotracheal anesthesia was induced, the table was turned 45 degrees and the patient was placed in a semi-Fowler position.  The nose was then topically anesthetized with Lidocaine, cotton pledgets were placed within each nostril. After approximately 5 minutes, this was removed at which time a local anesthetic of 1% Lidocaine 1:100,000 units of Epinephrine was used to inject the inferior turbinates in the nasal septum. A total of 12 ml was used. Examination of the nose showed a severe left nasal septal deformity and tremendous hypertrophied inferior turbinate.  Beginning on the right hand side a hemitransfixion incision was then created on the leading edge of the septum on the right.  A subperichondrial plane was elevated posteriorly on the left and taken back to the perpendicular plate of the ethmoid where subperiosteal plane was elevated posteriorly on the left.  An inferior rim of cartilage was removed anteriorly with care taken to leave an anterior strut to prevent nasal collapse. With this strut removed the perpendicular plate of the ethmoid was separated from the quadrangular cartilage. The large septal spur was removed.  The septum was then replaced in the midline. Reinspection through each nostril showed excellent reduction of the septal deformity. A left posterior inferior fenestration was then created to allow hematoma drainage.  With the septoplasty completed, beginning on the left-hand side, a 15 blade was used  to incise along the inferior edge of the inferior turbinate. A superior laterally based flap was then elevated. The underlying conchal bone of mucosa was excised using Knight scissors. The flap was then laid back over the turbinate stump and cauterized using suction cautery. In a similar fashion the submucous resection was performed on the right.  With the submucous resection completed bilaterally and no active bleeding, the hemitransfixion incision was then closed using two interrupted 3-0 chromic sutures.  Plastic nasal septal splints were placed within each nostril and affixed to the septum using a 3-0 nylon suture. Stammberger was then used beneath each inferior turbinate for hemostasis.    The patient tolerated the procedure well, was returned to anesthesia, extubated in the operating room, and taken to the recovery room in stable condition.    CULTURES:  None.  SPECIMENS:  None.  ESTIMATED BLOOD LOSS:  25 cc.  Riot Barrick T  03/23/2017  10:36 AM

## 2017-06-14 ENCOUNTER — Other Ambulatory Visit: Payer: Self-pay | Admitting: Family Medicine

## 2017-06-14 NOTE — Telephone Encounter (Signed)
Last Cr and K+ reviewed; Rx approved 

## 2017-07-09 ENCOUNTER — Other Ambulatory Visit: Payer: Self-pay | Admitting: Family Medicine

## 2017-07-09 MED ORDER — INDOMETHACIN 50 MG PO CAPS: 50.0000 mg | ORAL_CAPSULE | Freq: Three times a day (TID) | ORAL | 0 refills | Status: DC | PRN

## 2017-07-21 ENCOUNTER — Other Ambulatory Visit: Payer: Self-pay | Admitting: Family Medicine

## 2017-10-23 ENCOUNTER — Telehealth: Payer: Self-pay | Admitting: Family Medicine

## 2017-10-23 NOTE — Telephone Encounter (Signed)
Patient's last appointment was almost a year ago Please ask him to schedule a visit for his gout, mood, and hypertension If he is in need of a physical, that can be done on another day OR TWO back to back visits, but I can't cover all of those issues in twenty minutes; thank you

## 2017-10-25 ENCOUNTER — Other Ambulatory Visit: Payer: Self-pay

## 2017-10-25 MED ORDER — SERTRALINE HCL 100 MG PO TABS: ORAL_TABLET | ORAL | 0 refills | Status: DC

## 2017-10-25 NOTE — Telephone Encounter (Signed)
Spoke with pt and he scheduled an appt for September. Not able to come sooner due to not having insurance (just started a new job). He also scheduled his physical for October.

## 2017-11-29 ENCOUNTER — Other Ambulatory Visit: Payer: Self-pay | Admitting: Family Medicine

## 2017-12-14 ENCOUNTER — Encounter: Payer: Self-pay | Admitting: Family Medicine

## 2017-12-14 ENCOUNTER — Ambulatory Visit: Payer: BLUE CROSS/BLUE SHIELD | Admitting: Family Medicine

## 2017-12-14 VITALS — BP 132/78 | HR 95 | Temp 98.4°F | Ht 79.0 in | Wt 374.1 lb

## 2017-12-14 DIAGNOSIS — E785 Hyperlipidemia, unspecified: Secondary | ICD-10-CM

## 2017-12-14 DIAGNOSIS — R7989 Other specified abnormal findings of blood chemistry: Secondary | ICD-10-CM

## 2017-12-14 DIAGNOSIS — M10071 Idiopathic gout, right ankle and foot: Secondary | ICD-10-CM | POA: Diagnosis not present

## 2017-12-14 DIAGNOSIS — E669 Obesity, unspecified: Secondary | ICD-10-CM | POA: Diagnosis not present

## 2017-12-14 DIAGNOSIS — Z5181 Encounter for therapeutic drug level monitoring: Secondary | ICD-10-CM

## 2017-12-14 DIAGNOSIS — I10 Essential (primary) hypertension: Secondary | ICD-10-CM

## 2017-12-14 DIAGNOSIS — R635 Abnormal weight gain: Secondary | ICD-10-CM

## 2017-12-14 NOTE — Progress Notes (Signed)
BP 132/78   Pulse 95   Temp 98.4 F (36.9 C) (Oral)   Ht 6\' 7"  (2.007 m)   Wt (!) 374 lb 1.6 oz (169.7 kg)   SpO2 97%   BMI 42.14 kg/m    Subjective:    Patient ID: Darren Robinson, male    DOB: January 14, 1976, 42 y.o.   MRN: [id number]  HPI: Darren Robinson is a 42 y.o. male  Chief Complaint  Patient presents with  . Follow-up  . Medication Refill    HPI  Patient is here for f/u  Occasional gout flares; beer is a trigger; drinking bourbon occasionally; he does not have the urge to crash and drink; less than 14 drinks per week; 4 to 6 a month, social drinker only He now has energy in the evenings Sleeping okay with less stress    Office Visit from 12/14/2017 in [facility]  AUDIT-C Score  5     He has HTN; BP was 117/77 at work screening the other day; first thing in the morning; not much salt on the food; has sleep apnea, wearing machine; just one lisinopril  He has a low testosterone and will be seeing urologist very soon; he was really stressed with previous job; wondering about effect it may have had on T levels  He has gained significant weight; thinks it is just eating  Depression screen [facility] 2/9 12/14/2017 11/23/2016 08/10/2016 02/20/2016 08/05/2015  Decreased Interest 0 0 0 3 0  Down, Depressed, Hopeless 0 0 0 3 0  PHQ - 2 Score 0 0 0 6 0  Altered sleeping 0 - - 3 -  Tired, decreased energy 3 - - 1 -  Change in appetite 3 - - 3 -  Feeling bad or failure about yourself  0 - - 0 -  Trouble concentrating 0 - - 0 -  Moving slowly or fidgety/restless 0 - - 0 -  Suicidal thoughts 0 - - 0 -  PHQ-9 Score 6 - - 13 -  Difficult doing work/chores Somewhat difficult - - Not difficult at all -    Relevant past medical, surgical, family and social history reviewed Past Medical History:  Diagnosis Date  . Essential hypertension, benign 02/11/2015  . Fatigue   . Gout   . Hx of gout 08/05/2015  . Hypertension   . Obesity 02/11/2015  . OSA (obstructive  sleep apnea)   . Sleep apnea   . Vitamin D deficiency disease    Past Surgical History:  Procedure Laterality Date  . APPENDECTOMY    . NASAL SEPTOPLASTY W/ TURBINOPLASTY Bilateral 03/23/2017   Procedure: NASAL SEPTOPLASTY WITH TURBINATE REDUCTION;  Surgeon: Beverly Gust, MD;  Location: [facility] ORS;  Service: ENT;  Laterality: Bilateral;   Family History  Problem Relation Age of Onset  . Alcohol abuse Mother   . Arthritis Mother   . Heart disease Mother   . Mental illness Mother   . Thyroid disease Mother   . Diabetes Father   . Heart attack Father   . Kidney disease Father        On Dialysis  . Heart disease Father   . Hyperlipidemia Father   . Thyroid disease Sister   . Hyperlipidemia Sister   . Hypertension Sister   . Lung disease Maternal Grandfather   . Heart attack Paternal Grandmother   . Cancer Neg Hx   . Stroke Neg Hx    Social History   Tobacco Use  .  Smoking status: Former Smoker    Packs/day: 1.00    Years: 10.00    Pack years: 10.00    Types: Cigarettes    Last attempt to quit: 04/06/2004    Years since quitting: 13.7  . Smokeless tobacco: Current User    Types: Snuff, Chew  Substance Use Topics  . Alcohol use: Yes    Alcohol/week: 10.0 standard drinks    Types: 10 Standard drinks or equivalent per week    Comment: Social  . Drug use: No    Interim medical history since last visit reviewed. Allergies and medications reviewed  Review of Systems Per HPI unless specifically indicated above     Objective:    BP 132/78   Pulse 95   Temp 98.4 F (36.9 C) (Oral)   Ht 6\' 7"  (2.007 m)   Wt (!) 374 lb 1.6 oz (169.7 kg)   SpO2 97%   BMI 42.14 kg/m   Wt Readings from Last 3 Encounters:  12/14/17 (!) 374 lb 1.6 oz (169.7 kg)  03/23/17 (!) 326 lb (147.9 kg)  03/19/17 (!) 326 lb (147.9 kg)    Physical Exam  Constitutional: He appears well-developed and well-nourished. No distress.  Significant weight gain, morbidly obese; no evidence of fluid  overload  HENT:  Head: Normocephalic and atraumatic.  Eyes: EOM are normal. No scleral icterus.  Neck: No thyromegaly present.  Cardiovascular: Normal rate and regular rhythm.  Pulmonary/Chest: Effort normal and breath sounds normal.  Abdominal: Soft. Bowel sounds are normal. He exhibits no distension.  Musculoskeletal: He exhibits no edema.  Neurological: Coordination normal.  Skin: Skin is warm and dry. No pallor.  Psychiatric: He has a normal mood and affect. His behavior is normal. Judgment and thought content normal.      Assessment & Plan:   Problem List Items Addressed This Visit      Cardiovascular and Mediastinum   Essential hypertension, benign (Chronic)    Controlled today on just one agent; stress level greatly improved        Musculoskeletal and Integument   Gout of right foot (Chronic)   Relevant Orders   Uric acid (Completed)     Other   Medication monitoring encounter   Relevant Orders   Comprehensive metabolic panel (Completed)   Dyslipidemia - Primary   Relevant Orders   Lipid panel (Completed)   RESOLVED: Obesity (BMI 35.0-39.9 without comorbidity)   Morbid obesity ([place])    Significant weight gain, no evidence of fluid overload, pt believes 2/2 eating; will check TSH today; encouraged healthy lifestyle       Other Visit Diagnoses    Low testosterone in male       Relevant Orders   Testosterone,Free and Total   Weight gain       Relevant Orders   TSH       Follow up plan: No follow-ups on file.  An after-visit summary was printed and given to the patient at [place].  Please see the patient instructions which may contain other information and recommendations beyond what is mentioned above in the assessment and plan.  No orders of the defined types were placed in this encounter.   Orders Placed This Encounter  Procedures  . Testosterone,Free and Total  . Comprehensive metabolic panel  . Uric acid  . Lipid panel  . TSH  . TSH

## 2017-12-14 NOTE — Assessment & Plan Note (Signed)
Controlled today on just one agent; stress level greatly improved

## 2017-12-14 NOTE — Patient Instructions (Signed)
Try to limit saturated fats in your diet (bologna, hot dogs, barbeque, cheeseburgers, hamburgers, steak, bacon, sausage, cheese, etc.) and get more fresh fruits, vegetables, and whole grains Try to follow the DASH guidelines (DASH stands for Dietary Approaches to Stop Hypertension). Try to limit the sodium in your diet to no more than 1,500mg  of sodium per day. Certainly try to not exceed 2,000 mg per day at the very most. Do not add salt when cooking or at the table.  Check the sodium amount on labels when shopping, and choose items lower in sodium when given a choice. Avoid or limit foods that already contain a lot of sodium. Eat a diet rich in fruits and vegetables and whole grains, and try to lose weight if overweight or obese Check out the information at familydoctor.org entitled "Nutrition for Weight Loss: What You Need to Know about Fad Diets" Try to lose between 1-2 pounds per week by taking in fewer calories and burning off more calories You can succeed by limiting portions, limiting foods dense in calories and fat, becoming more active, and drinking 8 glasses of water a day (64 ounces) Don't skip meals, especially breakfast, as skipping meals may alter your metabolism Do not use over-the-counter weight loss pills or gimmicks that claim rapid weight loss A healthy BMI (or body mass index) is between 18.5 and 24.9 You can calculate your ideal BMI at the [place] website ClubMonetize.fr Please have morning labs done at [place]

## 2017-12-16 DIAGNOSIS — E785 Hyperlipidemia, unspecified: Secondary | ICD-10-CM | POA: Diagnosis not present

## 2017-12-16 DIAGNOSIS — M10071 Idiopathic gout, right ankle and foot: Secondary | ICD-10-CM | POA: Diagnosis not present

## 2017-12-16 DIAGNOSIS — Z5181 Encounter for therapeutic drug level monitoring: Secondary | ICD-10-CM | POA: Diagnosis not present

## 2017-12-17 LAB — LIPID PANEL
CHOL/HDL RATIO: 5.8 ratio — AB (ref 0.0–5.0)
Cholesterol, Total: 257 mg/dL — ABNORMAL HIGH (ref 100–199)
HDL: 44 mg/dL (ref >39)
LDL Calculated: 146 mg/dL — ABNORMAL HIGH (ref 0–99)
TRIGLYCERIDES: 336 mg/dL — AB (ref 0–149)
VLDL CHOLESTEROL CAL: 67 mg/dL — AB (ref 5–40)

## 2017-12-17 LAB — COMPREHENSIVE METABOLIC PANEL
ALBUMIN: 4.7 g/dL (ref 3.5–5.5)
ALT: 48 IU/L — AB (ref 0–44)
AST: 38 IU/L (ref 0–40)
Albumin/Globulin Ratio: 2 (ref 1.2–2.2)
Alkaline Phosphatase: 50 IU/L (ref 39–117)
BUN/Creatinine Ratio: 18 (ref 9–20)
BUN: 24 mg/dL (ref 6–24)
Bilirubin Total: 0.4 mg/dL (ref 0.0–1.2)
CO2: 22 mmol/L (ref 20–29)
CREATININE: 1.3 mg/dL — AB (ref 0.76–1.27)
Calcium: 10.1 mg/dL (ref 8.7–10.2)
Chloride: 97 mmol/L (ref 96–106)
GFR, EST AFRICAN AMERICAN: 78 mL/min/{1.73_m2} (ref >59)
GFR, EST NON AFRICAN AMERICAN: 68 mL/min/{1.73_m2} (ref >59)
GLUCOSE: 85 mg/dL (ref 65–99)
Globulin, Total: 2.4 g/dL (ref 1.5–4.5)
POTASSIUM: 4.6 mmol/L (ref 3.5–5.2)
Sodium: 136 mmol/L (ref 134–144)
TOTAL PROTEIN: 7.1 g/dL (ref 6.0–8.5)

## 2017-12-17 LAB — URIC ACID: Uric Acid: 9.1 mg/dL — ABNORMAL HIGH (ref 3.7–8.6)

## 2017-12-17 LAB — TSH: TSH: 3.21 u[IU]/mL (ref 0.450–4.500)

## 2017-12-27 DIAGNOSIS — Z6841 Body Mass Index (BMI) 40.0 and over, adult: Secondary | ICD-10-CM | POA: Insufficient documentation

## 2017-12-27 NOTE — Assessment & Plan Note (Signed)
Significant weight gain, no evidence of fluid overload, pt believes 2/2 eating; will check TSH today; encouraged healthy lifestyle

## 2017-12-31 DIAGNOSIS — E291 Testicular hypofunction: Secondary | ICD-10-CM | POA: Diagnosis not present

## 2017-12-31 DIAGNOSIS — G4733 Obstructive sleep apnea (adult) (pediatric): Secondary | ICD-10-CM | POA: Diagnosis not present

## 2017-12-31 DIAGNOSIS — N401 Enlarged prostate with lower urinary tract symptoms: Secondary | ICD-10-CM | POA: Diagnosis not present

## 2017-12-31 DIAGNOSIS — Z125 Encounter for screening for malignant neoplasm of prostate: Secondary | ICD-10-CM | POA: Diagnosis not present

## 2017-12-31 DIAGNOSIS — Z79899 Other long term (current) drug therapy: Secondary | ICD-10-CM | POA: Diagnosis not present

## 2018-01-03 ENCOUNTER — Other Ambulatory Visit: Payer: Self-pay | Admitting: Nurse Practitioner

## 2018-01-03 ENCOUNTER — Other Ambulatory Visit: Payer: Self-pay | Admitting: Family Medicine

## 2018-01-04 NOTE — Telephone Encounter (Signed)
refills  

## 2018-01-07 ENCOUNTER — Ambulatory Visit: Payer: PRIVATE HEALTH INSURANCE | Admitting: Family Medicine

## 2018-01-07 ENCOUNTER — Encounter: Payer: Self-pay | Admitting: Family Medicine

## 2018-01-07 VITALS — BP 130/74 | HR 77 | Temp 98.6°F | Ht 79.0 in | Wt 374.4 lb

## 2018-01-07 DIAGNOSIS — Z0000 Encounter for general adult medical examination without abnormal findings: Secondary | ICD-10-CM

## 2018-01-07 NOTE — Patient Instructions (Addendum)
Aim for ten minutes three days a week of cardio, walking, biking, elliptical  I do recommend yearly flu shots; for individuals who don't want flu shots, try to practice excellent hand hygiene, and avoid nursing homes, day cares, and hospitals during peak flu season; taking additional vitamin C daily during flu/cold season may help boost your immune system too  Check out the information at familydoctor.org entitled "Nutrition for Weight Loss: What You Need to Know about Fad Diets" Try to lose between 1-2 pounds per week by taking in fewer calories and burning off more calories You can succeed by limiting portions, limiting foods dense in calories and fat, becoming more active, and drinking 8 glasses of water a day (64 ounces) Don't skip meals, especially breakfast, as skipping meals may alter your metabolism Do not use over-the-counter weight loss pills or gimmicks that claim rapid weight loss A healthy BMI (or body mass index) is between 18.5 and 24.9 You can calculate your ideal BMI at the [place] website ClubMonetize.fr   Health Maintenance, Male A healthy lifestyle and preventive care is important for your health and wellness. Ask your health care provider about what schedule of regular examinations is right for you. What should I know about weight and diet? Eat a Healthy Diet  Eat plenty of vegetables, fruits, whole grains, low-fat dairy products, and lean protein.  Do not eat a lot of foods high in solid fats, added sugars, or salt.  Maintain a Healthy Weight Regular exercise can help you achieve or maintain a healthy weight. You should:  Do at least 150 minutes of exercise each week. The exercise should increase your heart rate and make you sweat (moderate-intensity exercise).  Do strength-training exercises at least twice a week.  Watch Your Levels of Cholesterol and Blood Lipids  Have your blood tested for lipids and cholesterol  every 5 years starting at 41 years of age. If you are at high risk for heart disease, you should start having your blood tested when you are 41 years old. You may need to have your cholesterol levels checked more often if: ? Your lipid or cholesterol levels are high. ? You are older than 41 years of age. ? You are at high risk for heart disease.  What should I know about cancer screening? Many types of cancers can be detected early and may often be prevented. Lung Cancer  You should be screened every year for lung cancer if: ? You are a current smoker who has smoked for at least 30 years. ? You are a former smoker who has quit within the past 15 years.  Talk to your health care provider about your screening options, when you should start screening, and how often you should be screened.  Colorectal Cancer  Routine colorectal cancer screening usually begins at 41 years of age and should be repeated every 5-10 years until you are 41 years old. You may need to be screened more often if early forms of precancerous polyps or small growths are found. Your health care provider may recommend screening at an earlier age if you have risk factors for colon cancer.  Your health care provider may recommend using home test kits to check for hidden blood in the stool.  A small camera at the end of a tube can be used to examine your colon (sigmoidoscopy or colonoscopy). This checks for the earliest forms of colorectal cancer.  Prostate and Testicular Cancer  Depending on your age and overall health, your health  care provider may do certain tests to screen for prostate and testicular cancer.  Talk to your health care provider about any symptoms or concerns you have about testicular or prostate cancer.  Skin Cancer  Check your skin from head to toe regularly.  Tell your health care provider about any new moles or changes in moles, especially if: ? There is a change in a mole's size, shape, or  color. ? You have a mole that is larger than a pencil eraser.  Always use sunscreen. Apply sunscreen liberally and repeat throughout the day.  Protect yourself by wearing long sleeves, pants, a wide-brimmed hat, and sunglasses when outside.  What should I know about heart disease, diabetes, and high blood pressure?  If you are 66-29 years of age, have your blood pressure checked every 3-5 years. If you are 20 years of age or older, have your blood pressure checked every year. You should have your blood pressure measured twice-once when you are at a hospital or clinic, and once when you are not at a hospital or clinic. Record the average of the two measurements. To check your blood pressure when you are not at a hospital or clinic, you can use: ? An automated blood pressure machine at a pharmacy. ? A home blood pressure monitor.  Talk to your health care provider about your target blood pressure.  If you are between 33-87 years old, ask your health care provider if you should take aspirin to prevent heart disease.  Have regular diabetes screenings by checking your fasting blood sugar level. ? If you are at a normal weight and have a low risk for diabetes, have this test once every three years after the age of 52. ? If you are overweight and have a high risk for diabetes, consider being tested at a younger age or more often.  A one-time screening for abdominal aortic aneurysm (AAA) by ultrasound is recommended for men aged 53-75 years who are current or former smokers. What should I know about preventing infection? Hepatitis B If you have a higher risk for hepatitis B, you should be screened for this virus. Talk with your health care provider to find out if you are at risk for hepatitis B infection. Hepatitis C Blood testing is recommended for:  Everyone born from [birth year] through 1964.  Anyone with known risk factors for hepatitis C.  Sexually Transmitted Diseases (STDs)  You should be  screened each year for STDs including gonorrhea and chlamydia if: ? You are sexually active and are younger than 41 years of age. ? You are older than 41 years of age and your health care provider tells you that you are at risk for this type of infection. ? Your sexual activity has changed since you were last screened and you are at an increased risk for chlamydia or gonorrhea. Ask your health care provider if you are at risk.  Talk with your health care provider about whether you are at high risk of being infected with HIV. Your health care provider may recommend a prescription medicine to help prevent HIV infection.  What else can I do?  Schedule regular health, dental, and eye exams.  Stay current with your vaccines (immunizations).  Do not use any tobacco products, such as cigarettes, chewing tobacco, and e-cigarettes. If you need help quitting, ask your health care provider.  Limit alcohol intake to no more than 2 drinks per day. One drink equals 12 ounces of beer, 5 ounces of wine,  or 1 ounces of hard liquor.  Do not use street drugs.  Do not share needles.  Ask your health care provider for help if you need support or information about quitting drugs.  Tell your health care provider if you often feel depressed.  Tell your health care provider if you have ever been abused or do not feel safe at home. This information is not intended to replace advice given to you by your health care provider. Make sure you discuss any questions you have with your health care provider. Document Released: 09/19/2007 Document Revised: 11/20/2015 Document Reviewed: 12/25/2014 Elsevier Interactive Patient Education  [employer].

## 2018-01-07 NOTE — Progress Notes (Signed)
BP 130/74   Pulse 77   Temp 98.6 F (37 C)   Ht 6\' 7"  (2.007 m)   Wt (!) 374 lb 6.4 oz (169.8 kg)   SpO2 97%   BMI 42.18 kg/m    Subjective:    Patient ID: Darren Robinson, male    DOB: 1976/01/21, 42 y.o.   MRN: [id number]  HPI: Darren Robinson is a 42 y.o. male  Chief Complaint  Patient presents with  . Annual Exam    HPI Patient is here for his complete physical He has to lose 30 pounds by this time next year; he has to do that for work  USPSTF grade A and B recommendations Depression:  Depression screen [facility] 2/9 01/07/2018 12/14/2017 11/23/2016 08/10/2016 02/20/2016  Decreased Interest 0 0 0 0 3  Down, Depressed, Hopeless 0 0 0 0 3  PHQ - 2 Score 0 0 0 0 6  Altered sleeping 0 0 - - 3  Tired, decreased energy 0 3 - - 1  Change in appetite 1 3 - - 3  Feeling bad or failure about yourself  0 0 - - 0  Trouble concentrating 0 0 - - 0  Moving slowly or fidgety/restless 0 0 - - 0  Suicidal thoughts 0 0 - - 0  PHQ-9 Score 1 6 - - 13  Difficult doing work/chores Not difficult at all Somewhat difficult - - Not difficult at all   Hypertension: BP Readings from Last 3 Encounters:  01/07/18 130/74  12/14/17 132/78  03/23/17 (!) 173/104   Obesity: we talked about this at length; he is aware that he needs to lose weight; he believes that the change in job has contributed significantly; 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 Yves Dill, urologist, checked DRE No results found for: PSA Colorectal cancer: no fam hx AAA:  n/a Aspirin: n/a Diet: will increase veggie or fruit Exercise: he likes to lift weight; he has a gym at his house; he just does not use it; he is breathing better after the surgery (turbinoplasty and septoplasty); likes elliptical; does not like running on concrete; has a nice bike, but moving to a farm and will have plenty of area to ride Alcohol:    Office Visit from 01/07/2018 in [facility]  AUDIT-C Score  4     Tobacco use: quit long time HIV, hep B, hep C: not intersted STD testing and prevention (chl/gon/syphilis): not intersted Glucose:  Glucose  Date Value Ref Range Status  12/16/2017 85 65 - 99 mg/dL Final  08/15/2015 93 65 - 99 mg/dL Final  02/07/2015 83 65 - 99 mg/dL Final   Glucose, Bld  Date Value Ref Range Status  11/02/2016 76 65 - 99 mg/dL Final  08/10/2016 92 65 - 99 mg/dL Final   Lipids:  Lab Results  Component  Value Date   CHOL 257 (H) 12/16/2017   CHOL 213 (H) 08/10/2016   CHOL 199 08/15/2015   Lab Results  Component Value Date   HDL 44 12/16/2017   HDL 41 08/10/2016   HDL 37 (L) 08/15/2015   Lab Results  Component Value Date   LDLCALC 146 (H) 12/16/2017   LDLCALC 137 (H) 08/10/2016   LDLCALC 128 (H) 08/15/2015   Lab Results  Component Value Date   TRIG 336 (H) 12/16/2017   TRIG 175 (H) 08/10/2016   TRIG 169 (H) 08/15/2015   Lab Results  Component Value Date   CHOLHDL 5.8 (H) 12/16/2017   CHOLHDL 5.2 (H) 08/10/2016   No results found for: LDLDIRECT   Depression screen [facility] 2/9 01/07/2018 12/14/2017 11/23/2016 08/10/2016 02/20/2016  Decreased Interest 0 0 0 0 3  Down, Depressed, Hopeless 0 0 0 0 3  PHQ - 2 Score 0 0 0 0 6  Altered sleeping 0 0 - - 3  Tired, decreased energy 0 3 - - 1  Change in appetite 1 3 - - 3  Feeling bad or failure about yourself  0 0 - - 0  Trouble concentrating 0 0 - - 0  Moving  slowly or fidgety/restless 0 0 - - 0  Suicidal thoughts 0 0 - - 0  PHQ-9 Score 1 6 - - 13  Difficult doing work/chores Not difficult at all Somewhat difficult - - Not difficult at all   Fall Risk  12/14/2017 11/23/2016 08/10/2016 08/05/2015 02/21/2015  Falls in the past year? No No No Yes No  Number falls in past yr: - - - 2 or more -  Injury with Fall? - - - No -    Relevant past medical, surgical, family and social history reviewed Past Medical History:  Diagnosis Date  . Essential hypertension, benign 02/11/2015  . Fatigue   . Gout   . Hx of gout 08/05/2015  . Hypertension   . Obesity 02/11/2015  . OSA (obstructive sleep apnea)   . Sleep apnea   . Vitamin D deficiency disease    Past Surgical History:  Procedure Laterality Date  . APPENDECTOMY    . NASAL SEPTOPLASTY W/ TURBINOPLASTY Bilateral 03/23/2017   Procedure: NASAL SEPTOPLASTY WITH TURBINATE REDUCTION;  Surgeon: Beverly Gust, MD;  Location: [facility] ORS;  Service: ENT;  Laterality: Bilateral;   Family History  Problem Relation Age of Onset  . Alcohol abuse Mother   . Arthritis Mother   . Heart disease Mother   . Mental illness Mother   . Thyroid disease Mother   . Diabetes Father   . Heart attack Father   . Kidney disease Father        On Dialysis  . Heart disease Father   . Hyperlipidemia Father   . Thyroid disease Sister   . Hyperlipidemia Sister   . Hypertension Sister   . Lung disease Maternal Grandfather   . Heart attack Paternal Grandmother   . Cancer Neg Hx   . Stroke Neg Hx    Social History   Tobacco Use  . Smoking status: Former Smoker    Packs/day: 1.00    Years: 10.00    Pack years: 10.00    Types: Cigarettes    Last attempt to quit: 04/06/2004    Years since quitting: 13.7  . Smokeless tobacco: Former Systems developer    Types: Snuff, Sarina Ser    Quit date: 2018  Substance Use Topics  . Alcohol use: Yes  Alcohol/week: 10.0 standard drinks    Types: 10 Standard drinks or equivalent per week    Comment:  Social  . Drug use: No     Office Visit from 01/07/2018 in [facility]  AUDIT-C Score  4      Interim medical history since last visit reviewed. Allergies and medications reviewed  Review of Systems Per HPI unless specifically indicated above     Objective:    BP 130/74   Pulse 77   Temp 98.6 F (37 C)   Ht 6\' 7"  (2.007 m)   Wt (!) 374 lb 6.4 oz (169.8 kg)   SpO2 97%   BMI 42.18 kg/m   Wt Readings from Last 3 Encounters:  01/07/18 (!) 374 lb 6.4 oz (169.8 kg)  12/14/17 (!) 374 lb 1.6 oz (169.7 kg)  03/23/17 (!) 326 lb (147.9 kg)    Physical Exam  Constitutional: He appears well-developed and well-nourished. No distress.  HENT:  Head: Normocephalic and atraumatic.  Nose: Nose normal.  Mouth/Throat: Oropharynx is clear and moist.  Eyes: EOM are normal. No scleral icterus.  Neck: No JVD present. No thyromegaly present.  Cardiovascular: Normal rate, regular rhythm and normal heart sounds.  Pulmonary/Chest: Effort normal and breath sounds normal. No respiratory distress. He has no wheezes. He has no rales.  Abdominal: Soft. Bowel sounds are normal. He exhibits no distension. There is no tenderness. There is no guarding.  Musculoskeletal: Normal range of motion. He exhibits no edema.  Lymphadenopathy:    He has no cervical adenopathy.  Neurological: He is alert. He displays normal reflexes. He exhibits normal muscle tone. Coordination normal.  Skin: Skin is warm and dry. No rash noted. He is not diaphoretic. No erythema. No pallor.  Soft fleshy growth on left side dome of scalp, firmer flesh-colored mole on the right top of scalp  Psychiatric: He has a normal mood and affect. His behavior is normal. Judgment and thought content normal.      Assessment & Plan:   Problem List Items Addressed This Visit      Other   Preventative health care - Primary    USPSTF grade A and B recommendations reviewed with patient; age-appropriate recommendations,  preventive care, screening tests, etc discussed and encouraged; healthy living encouraged; see AVS for patient education given to patient       Morbid obesity ([place])    BMI >40; encouraged weight loss; talked about activity, nutrition, etc          Follow up plan: Return in about 1 year (around 01/08/2019) for complete physical.  An after-visit summary was printed and given to the patient at [place].  Please see the patient instructions which may contain other information and recommendations beyond what is mentioned above in the assessment and plan.

## 2018-01-17 NOTE — Assessment & Plan Note (Signed)
USPSTF grade A and B recommendations reviewed with patient; age-appropriate recommendations, preventive care, screening tests, etc discussed and encouraged; healthy living encouraged; see AVS for patient education given to patient  

## 2018-01-17 NOTE — Assessment & Plan Note (Signed)
BMI >40; encouraged weight loss; talked about activity, nutrition, etc

## 2018-02-17 ENCOUNTER — Other Ambulatory Visit: Payer: Self-pay | Admitting: Family Medicine

## 2018-03-10 ENCOUNTER — Ambulatory Visit: Payer: BLUE CROSS/BLUE SHIELD | Admitting: Nurse Practitioner

## 2018-03-10 ENCOUNTER — Encounter: Payer: Self-pay | Admitting: Nurse Practitioner

## 2018-03-10 VITALS — BP 126/84 | HR 67 | Temp 98.4°F | Resp 16 | Ht 79.0 in | Wt 377.5 lb

## 2018-03-10 DIAGNOSIS — R0981 Nasal congestion: Secondary | ICD-10-CM | POA: Diagnosis not present

## 2018-03-10 DIAGNOSIS — J069 Acute upper respiratory infection, unspecified: Secondary | ICD-10-CM | POA: Diagnosis not present

## 2018-03-10 MED ORDER — FLUTICASONE PROPIONATE 50 MCG/ACT NA SUSP: 2.0000 | Freq: Every day | NASAL | 6 refills | Status: DC

## 2018-03-10 NOTE — Patient Instructions (Signed)
You likely have a viral upper respiratory infection (URI). Antibiotics will not reduce the number of days you are ill or prevent you from getting bacterial rhinosinusitis. A URI can take up to 14 days to resolve, but typically last between 7-11 days. Your body is so smart and strong that it will be fighting this illness off for you but it is important that you drink plenty of fluids, rest. Cover your nose/mouth when you cough or sneeze and wash your hands well and often. Here are some helpful things you can use or pick up over the counter from the pharmacy to help with your symptoms:   For Fever/Pain: Acetaminophen every 6 hours as needed (maximum of 3000mg  a day). If you are still uncomfortable you can add ibuprofen OR naproxen  For coughing: try dextromethorphan for a cough suppressant, and/or a cool mist humidifier, lozenges  For sore throat: saline gargles, honey herbal tea, lozenges, throat spray  To dry out your nose: try an antihistamine like loratadine (non-sedating) or diphenhydramine (sedating) or others To relieve a stuffy nose: try an oral decongestant  Like pseudoephedrine if you are under the age of 58 and do not have high blood pressure, neti pot To make blowing your nose easier: guaifenesin

## 2018-03-10 NOTE — Progress Notes (Signed)
Name: Darren Robinson   MRN: [id number]    DOB: May 12, 1975   Date:03/10/2018       Progress Note  Subjective  Chief Complaint  Chief Complaint  Patient presents with  . Ear Pain    right ear pain started today. He has been having URI symptoms x 2 weeks. He woke up yesterday with fluid in his ear.    HPI  Patient endorses rhinorrhea, cough- green sputum intermittent for the past 2 weeks. Notes he has right ear pain started yesterday and muffled sounds. States was improving then had to go to various labs with changes in temperature and symptoms started again.  Denies fevers, chills, shortness of breath, sinus headache, or sinus pain.   Patient Active Problem List   Diagnosis Date Noted  . Morbid obesity ([place]) 12/27/2017  . Gout of right foot 08/10/2016  . Alcohol use 08/10/2016  . Medication monitoring encounter 08/10/2016  . Dyslipidemia 08/05/2015  . Elevated serum creatinine 08/05/2015  . Hx of gout 08/05/2015  . Essential hypertension, benign 02/11/2015  . Preventative health care 02/07/2015    Past Medical History:  Diagnosis Date  . Essential hypertension, benign 02/11/2015  . Fatigue   . Gout   . Hx of gout 08/05/2015  . Hypertension   . Obesity 02/11/2015  . OSA (obstructive sleep apnea)   . Sleep apnea   . Vitamin D deficiency disease     Past Surgical History:  Procedure Laterality Date  . APPENDECTOMY    . NASAL SEPTOPLASTY W/ TURBINOPLASTY Bilateral 03/23/2017   Procedure: NASAL SEPTOPLASTY WITH TURBINATE REDUCTION;  Surgeon: Beverly Gust, MD;  Location: [facility] ORS;  Service: ENT;  Laterality: Bilateral;    Social History   Tobacco Use  . Smoking status: Former Smoker    Packs/day: 1.00    Years: 10.00    Pack years: 10.00    Types: Cigarettes    Last attempt to quit: 04/06/2004    Years since quitting: 13.9  . Smokeless tobacco: Former Systems developer    Types: Snuff, Sarina Ser    Quit date: 2018  Substance Use Topics  . Alcohol use: Yes    Alcohol/week: 10.0  standard drinks    Types: 10 Standard drinks or equivalent per week    Comment: Social     Current Outpatient Medications:  .  acetaminophen (TYLENOL) 500 MG tablet, Take 1,000-1,500 mg by mouth every 6 (six) hours as needed for moderate pain or headache., Disp: , Rfl:  .  clomiPHENE (CLOMID) 50 MG tablet, Take 50 mg by mouth daily. , Disp: , Rfl:  .  colchicine 0.6 MG tablet, Two pills by mouth at onset of gout flare and then one pill one hour later, Disp: 3 tablet, Rfl: 2 .  indomethacin (INDOCIN) 50 MG capsule, Take 1 capsule (50 mg total) by mouth 3 (three) times daily as needed. Take with food, Disp: 21 capsule, Rfl: 0 .  lisinopril (PRINIVIL,ZESTRIL) 10 MG tablet, Take 1 tablet (10 mg total) by mouth daily., Disp: 30 tablet, Rfl: 5 .  sertraline (ZOLOFT) 100 MG tablet, Take 1 tablet (100 mg total) by mouth daily., Disp: 30 tablet, Rfl: 5  Allergies  Allergen Reactions  . Androgel [Testosterone] Other (See Comments)    Testicular atrophy    ROS   No other specific complaints in a complete review of systems (except as listed in HPI above).  Objective  Vitals:   03/10/18 1022  BP: 126/84  Pulse: 67  Resp: 16  Temp:  98.4 F (36.9 C)  TempSrc: Oral  SpO2: 98%  Weight: (!) 377 lb 8 oz (171.2 kg)  Height: 6\' 7"  (2.007 m)    Body mass index is 42.53 kg/m.  Nursing Note and Vital Signs reviewed.  Physical Exam  Constitutional: He is oriented to person, place, and time. He appears well-developed and well-nourished. He is cooperative.  HENT:  Head: Normocephalic and atraumatic.  Right Ear: Hearing, external ear and ear canal normal. No drainage or tenderness. Tympanic membrane is not injected. A middle ear effusion is present.  Left Ear: Hearing, external ear and ear canal normal. No drainage or tenderness. Tympanic membrane is not injected. A middle ear effusion is present.  Nose: Mucosal edema present. No sinus tenderness. Right sinus exhibits no maxillary sinus  tenderness and no frontal sinus tenderness. Left sinus exhibits no maxillary sinus tenderness and no frontal sinus tenderness.  Mouth/Throat: Uvula is midline, oropharynx is clear and moist and mucous membranes are normal.  Eyes: Conjunctivae are normal.  Cardiovascular: Normal rate, regular rhythm and normal heart sounds.  Pulmonary/Chest: Effort normal and breath sounds normal.  Musculoskeletal: Normal range of motion.  Neurological: He is alert and oriented to person, place, and time.  Psychiatric: He has a normal mood and affect. His speech is normal and behavior is normal. Judgment and thought content normal.     No results found for this or any previous visit (from the past 48 hour(s)).  Assessment & Plan  1. Upper respiratory tract infection, unspecified type Discussed OTC treatment- has been intermittent for 2 weeks but not severe- seems to be exaccerbated by allergies and work environment; has had multiple sinus infections prior to ENT surgery and states this does not feel nearly as bad- likely URI discussed OTC management will call us if worsening or not improving due to duration will consider antibiotics at that time.   2. Nasal congestion Bilateral effusions- recommend continuing daily antithistamine adding flonase, follow up as needed if unimproved - fluticasone (FLONASE) 50 MCG/ACT nasal spray; Place 2 sprays into both nostrils daily.  Dispense: 16 g; Refill: 6

## 2018-04-05 DIAGNOSIS — G4733 Obstructive sleep apnea (adult) (pediatric): Secondary | ICD-10-CM | POA: Diagnosis not present

## 2018-04-25 DIAGNOSIS — M9901 Segmental and somatic dysfunction of cervical region: Secondary | ICD-10-CM | POA: Diagnosis not present

## 2018-04-25 DIAGNOSIS — M5033 Other cervical disc degeneration, cervicothoracic region: Secondary | ICD-10-CM | POA: Diagnosis not present

## 2018-04-25 DIAGNOSIS — M5416 Radiculopathy, lumbar region: Secondary | ICD-10-CM | POA: Diagnosis not present

## 2018-04-25 DIAGNOSIS — M9903 Segmental and somatic dysfunction of lumbar region: Secondary | ICD-10-CM | POA: Diagnosis not present

## 2018-05-06 ENCOUNTER — Encounter: Payer: Self-pay | Admitting: Family Medicine

## 2018-05-06 ENCOUNTER — Other Ambulatory Visit: Payer: Self-pay | Admitting: Family Medicine

## 2018-05-06 NOTE — Telephone Encounter (Signed)
Lab Results  Component Value Date   CREATININE 1.30 (H) 12/16/2017   Last GFR 68  Warning put on sig: "may cause GI bleed" Note to patient in [place]

## 2018-05-09 ENCOUNTER — Other Ambulatory Visit: Payer: Self-pay

## 2018-05-09 NOTE — Telephone Encounter (Signed)
Left detailed voicemail

## 2018-05-09 NOTE — Telephone Encounter (Signed)
I am going to DENY this second request for indomethacin I sent a prescription in on 05/06/2018 Please contact patient Let him know that I recommend he see RHEUMATOLOGY for his recurrent gout flares; these can cause kidney damage long time and the indomethacin can cause GI bleeding Please REFER

## 2018-08-10 ENCOUNTER — Ambulatory Visit: Payer: Self-pay

## 2018-08-10 NOTE — Telephone Encounter (Signed)
Patient request order writen to [facility]  Patient's Job offered to Pay for it Just need order [facility] for Covid -19 sent to [facility].

## 2018-08-11 NOTE — Telephone Encounter (Signed)
Called patient I told him as of right now we are not doing any orders for the antibody testing.  He wanted explanation as to why?  He states his wife is a paramedic and does come in contact with sick people and [facility] is offering free testing.  Pt has no symptoms

## 2018-08-12 NOTE — Telephone Encounter (Signed)
Pt.notified

## 2018-08-12 NOTE — Telephone Encounter (Addendum)
[place] is not currently recommending testing as we do not have the recommendation from the [facility] for widespread antibody testing, we also do not know the reliability of the brand new tests at this time. In addition, a positive antibody test does not mean the patient is immune from having COVID19 again and may promote a false sense of safety.  This may change in the near future, but for right now, we are not recommending.

## 2018-08-24 ENCOUNTER — Other Ambulatory Visit: Payer: Self-pay | Admitting: Family Medicine

## 2018-11-02 ENCOUNTER — Other Ambulatory Visit: Payer: Self-pay | Admitting: Family Medicine

## 2018-11-30 ENCOUNTER — Ambulatory Visit (INDEPENDENT_AMBULATORY_CARE_PROVIDER_SITE_OTHER): Payer: BC Managed Care – PPO | Admitting: Nurse Practitioner

## 2018-11-30 ENCOUNTER — Encounter: Payer: Self-pay | Admitting: Nurse Practitioner

## 2018-11-30 ENCOUNTER — Other Ambulatory Visit: Payer: Self-pay

## 2018-11-30 VITALS — Temp 99.5°F

## 2018-11-30 DIAGNOSIS — R059 Cough, unspecified: Secondary | ICD-10-CM

## 2018-11-30 DIAGNOSIS — R519 Headache, unspecified: Secondary | ICD-10-CM

## 2018-11-30 DIAGNOSIS — R6889 Other general symptoms and signs: Secondary | ICD-10-CM

## 2018-11-30 DIAGNOSIS — R05 Cough: Secondary | ICD-10-CM

## 2018-11-30 DIAGNOSIS — R51 Headache: Secondary | ICD-10-CM | POA: Diagnosis not present

## 2018-11-30 DIAGNOSIS — J029 Acute pharyngitis, unspecified: Secondary | ICD-10-CM | POA: Diagnosis not present

## 2018-11-30 DIAGNOSIS — Z20822 Contact with and (suspected) exposure to covid-19: Secondary | ICD-10-CM

## 2018-11-30 DIAGNOSIS — Z7189 Other specified counseling: Secondary | ICD-10-CM

## 2018-11-30 MED ORDER — BENZONATATE 100 MG PO CAPS: 100.0000 mg | ORAL_CAPSULE | Freq: Two times a day (BID) | ORAL | 0 refills | Status: DC | PRN

## 2018-11-30 NOTE — Patient Instructions (Signed)
Cover your nose/mouth when you cough or sneeze and wash your hands well and often. Here are some helpful things you can use or pick up over the counter from the pharmacy to help with your symptoms:   For Fever/Pain: Acetaminophen every 6 hours as needed (maximum of 3000mg  a day). If you are still uncomfortable you can add ibuprofen OR naproxen  For coughing: try dextromethorphan for a cough suppressant, and/or a cool mist humidifier, lozenges  For sore throat: saline gargles, honey herbal tea, lozenges, throat spray  To dry out your nose: try an antihistamine like loratadine (non-sedating) or diphenhydramine (sedating) or others To relieve a stuffy nose: try a decongestant like flonase or try the neti pot To make blowing your nose easier and relieve chest congestion: guaifenesin 400mg  every 4-6 hours of guaifenesin ER [phone] mg every 12 hours. Do not take more than 2,400mg  a day.   This information is directly available on the CDC website: RunningShows.co.za.html    Source:CDC Reference to specific commercial products, manufacturers, companies, or trademarks does not constitute its endorsement or recommendation by the [place], [place], or Centers for [employer] and Prevention.

## 2018-11-30 NOTE — Progress Notes (Signed)
Virtual Visit via Video Note  I connected with Darren Robinson on 11/30/18 at  3:00 PM EDT by a video enabled telemedicine application and verified that I am speaking with the correct person using two identifiers.   Staff discussed the limitations of evaluation and management by telemedicine and the availability of in person appointments. The patient expressed understanding and agreed to proceed.  Patient location: home  My location: work office Other people present: none HPI  Patient endorses diarrhea started Sunday night, states Monday started to have fever, sore throat. Tuesday was coughing, headache, sinus pressure, night sweats. Had night sweats- had 102F last night. Has been taking dayquil and nyquil. Denies shortness of breath, fatigue, chest pain, palpitations. Strong productive cough- dark green.  Denies known positive COVID contacts. Diarrhea resolved.   PHQ2/9: Depression screen [facility] 2/9 11/30/2018 01/07/2018 12/14/2017 11/23/2016 08/10/2016  Decreased Interest 0 0 0 0 0  Down, Depressed, Hopeless 0 0 0 0 0  PHQ - 2 Score 0 0 0 0 0  Altered sleeping - 0 0 - -  Tired, decreased energy - 0 3 - -  Change in appetite - 1 3 - -  Feeling bad or failure about yourself  - 0 0 - -  Trouble concentrating - 0 0 - -  Moving slowly or fidgety/restless - 0 0 - -  Suicidal thoughts - 0 0 - -  PHQ-9 Score - 1 6 - -  Difficult doing work/chores - Not difficult at all Somewhat difficult - -    PHQ reviewed. Negative  Patient Active Problem List   Diagnosis Date Noted  . Morbid obesity ([place]) 12/27/2017  . Gout of right foot 08/10/2016  . Alcohol use 08/10/2016  . Medication monitoring encounter 08/10/2016  . Dyslipidemia 08/05/2015  . Elevated serum creatinine 08/05/2015  . Hx of gout 08/05/2015  . Essential hypertension, benign 02/11/2015  . Preventative health care 02/07/2015    Past Medical History:  Diagnosis Date  . Essential hypertension, benign 02/11/2015  . Fatigue   . Gout    . Hx of gout 08/05/2015  . Hypertension   . Obesity 02/11/2015  . OSA (obstructive sleep apnea)   . Sleep apnea   . Vitamin D deficiency disease     Past Surgical History:  Procedure Laterality Date  . APPENDECTOMY    . NASAL SEPTOPLASTY W/ TURBINOPLASTY Bilateral 03/23/2017   Procedure: NASAL SEPTOPLASTY WITH TURBINATE REDUCTION;  Surgeon: Beverly Gust, MD;  Location: [facility] ORS;  Service: ENT;  Laterality: Bilateral;    Social History   Tobacco Use  . Smoking status: Former Smoker    Packs/day: 1.00    Years: 10.00    Pack years: 10.00    Types: Cigarettes    Quit date: 04/06/2004    Years since quitting: 14.6  . Smokeless tobacco: Former Systems developer    Types: Snuff, Sarina Ser    Quit date: 2018  Substance Use Topics  . Alcohol use: Yes    Alcohol/week: 10.0 standard drinks    Types: 10 Standard drinks or equivalent per week    Comment: Social     Current Outpatient Medications:  .  acetaminophen (TYLENOL) 500 MG tablet, Take 1,000-1,500 mg by mouth every 6 (six) hours as needed for moderate pain or headache., Disp: , Rfl:  .  clomiPHENE (CLOMID) 50 MG tablet, Take 50 mg by mouth daily. , Disp: , Rfl:  .  colchicine 0.6 MG tablet, Two pills by mouth at onset of gout flare and then  one pill one hour later, Disp: 3 tablet, Rfl: 2 .  fluticasone (FLONASE) 50 MCG/ACT nasal spray, Place 2 sprays into both nostrils daily., Disp: 16 g, Rfl: 6 .  indomethacin (INDOCIN) 50 MG capsule, Take 1 capsule (50 mg total) by mouth 3 (three) times daily as needed. Take with food; may cause GI bleed, Disp: 21 capsule, Rfl: 0 .  lisinopril (PRINIVIL,ZESTRIL) 10 MG tablet, Take 1 tablet (10 mg total) by mouth daily., Disp: 30 tablet, Rfl: 5 .  Multiple Vitamins-Minerals (CENTRUM ULTRA MENS PO), Take by mouth., Disp: , Rfl:  .  sertraline (ZOLOFT) 100 MG tablet, Take 1 tablet (100 mg total) by mouth daily., Disp: 90 tablet, Rfl: 0  Allergies  Allergen Reactions  . Androgel [Testosterone] Other (See  Comments)    Testicular atrophy    ROS   No other specific complaints in a complete review of systems (except as listed in HPI above).  Objective  Vitals:   11/30/18 1241  Temp: 99.5 F (37.5 C)  TempSrc: Temporal     There is no height or weight on file to calculate BMI.  Nursing Note and Vital Signs reviewed.  Physical Exam   Constitutional: Patient appears well-developed and well-nourished. Appears ill but not in acute distress HENT: Head: Normocephalic and atraumatic. Pulmonary/Chest: Effort normal, strong cough during visit  Musculoskeletal: Normal range of motion,  Neurological: alert and oriented, speech normal.  Psychiatric: Patient has a normal mood and affect. behavior is normal. Judgment and thought content normal.    Assessment & Plan  1. Suspected Covid-19 Virus Infection - Novel Coronavirus, NAA ([facility])  2. Sore throat - Novel Coronavirus, NAA ([facility])  3. Cough - Novel Coronavirus, NAA ([facility]) - benzonatate (TESSALON) 100 MG capsule; Take 1 capsule (100 mg total) by mouth 2 (two) times daily as needed for cough.  Dispense: 20 capsule; Refill: 0  4. Acute intractable headache, unspecified headache type - Novel Coronavirus, NAA ([facility])  5. Educated About Covid-19 Virus Infection See AVS ER precautions discussed     Follow Up Instructions:    I discussed the assessment and treatment plan with the patient. The patient was provided an opportunity to ask questions and all were answered. The patient agreed with the plan and demonstrated an understanding of the instructions.   The patient was advised to call back or seek an in-person evaluation if the symptoms worsen or if the condition fails to improve as anticipated.  I provided 11 minutes of non-face-to-face time during this encounter.   [name], NP

## 2018-12-01 LAB — NOVEL CORONAVIRUS, NAA: SARS-CoV-2, NAA: NOT DETECTED

## 2018-12-02 ENCOUNTER — Encounter: Payer: Self-pay | Admitting: Nurse Practitioner

## 2018-12-08 ENCOUNTER — Other Ambulatory Visit: Payer: Self-pay | Admitting: Family Medicine

## 2019-01-03 ENCOUNTER — Other Ambulatory Visit: Payer: Self-pay | Admitting: Family Medicine

## 2019-01-03 ENCOUNTER — Encounter: Payer: Self-pay | Admitting: Family Medicine

## 2019-01-03 ENCOUNTER — Other Ambulatory Visit: Payer: Self-pay

## 2019-01-03 ENCOUNTER — Ambulatory Visit (INDEPENDENT_AMBULATORY_CARE_PROVIDER_SITE_OTHER): Payer: BC Managed Care – PPO | Admitting: Family Medicine

## 2019-01-03 VITALS — BP 138/80 | HR 84 | Temp 99.2°F | Resp 17 | Ht 79.0 in | Wt 395.0 lb

## 2019-01-03 DIAGNOSIS — I10 Essential (primary) hypertension: Secondary | ICD-10-CM

## 2019-01-03 DIAGNOSIS — Z6841 Body Mass Index (BMI) 40.0 and over, adult: Secondary | ICD-10-CM

## 2019-01-03 DIAGNOSIS — R7309 Other abnormal glucose: Secondary | ICD-10-CM

## 2019-01-03 DIAGNOSIS — R7989 Other specified abnormal findings of blood chemistry: Secondary | ICD-10-CM

## 2019-01-03 DIAGNOSIS — E785 Hyperlipidemia, unspecified: Secondary | ICD-10-CM

## 2019-01-03 DIAGNOSIS — Z0000 Encounter for general adult medical examination without abnormal findings: Secondary | ICD-10-CM

## 2019-01-03 DIAGNOSIS — G4733 Obstructive sleep apnea (adult) (pediatric): Secondary | ICD-10-CM

## 2019-01-03 DIAGNOSIS — F902 Attention-deficit hyperactivity disorder, combined type: Secondary | ICD-10-CM | POA: Insufficient documentation

## 2019-01-03 DIAGNOSIS — Z9989 Dependence on other enabling machines and devices: Secondary | ICD-10-CM

## 2019-01-03 DIAGNOSIS — Z8739 Personal history of other diseases of the musculoskeletal system and connective tissue: Secondary | ICD-10-CM

## 2019-01-03 NOTE — Assessment & Plan Note (Signed)
Abnormal elevated BMI >40 Encourage lifestyle diet and exercise

## 2019-01-03 NOTE — Assessment & Plan Note (Addendum)
Clinically consistent with ADHD based on current symptoms - >6 months or more, now affecting him more in new job, affecting his work and other aspects Complex history of depression/mood and prior OSA affecting daytime fatigue - now this is improved, and mood improved as well Fam history brother - adult onset ADHD  Plan - Proceed with handout info on ADHD testing, will need formal test - referral info give, if need order referral from [place] can contact us otherwise can schedule self referral to locations provided on AVS - Once formal testing- reevaluation for treatment, ideally can start treatment per Psychiatry and may need referral then we can determine longer term management plan - Possibly may not need medication if improved mental health mood upcoming with his move to new house, and maybe improve cognitive coping skills - Also will consider herbal natural remedy for focus, he has tried caffeine without good results  Follow-up as planned

## 2019-01-03 NOTE — Assessment & Plan Note (Signed)
Due for lipid panel with upcoming physical Not on statin

## 2019-01-03 NOTE — Assessment & Plan Note (Signed)
No recent gout flares Caution Indomethacin in future PRN use for gout

## 2019-01-03 NOTE — Assessment & Plan Note (Signed)
Mildly elevated initial BP, repeat manual check improved to near normal range Some stress today, has fluctuating BP. - Home BP readings limited, but wife can check  No known complications    Plan:  1. Continue current BP regimen Lisinopril 10mg  daily 2. Encourage improved lifestyle - low sodium diet, regular exercise 3. Continue monitor BP outside office, bring readings to next visit, if persistently >140/90 or new symptoms notify office sooner

## 2019-01-03 NOTE — Progress Notes (Signed)
Subjective:    Patient ID: Darren Robinson, male    DOB: 1975/10/14, 43 y.o.   MRN: [id number]  Darren Robinson is a 43 y.o. male presenting on 01/03/2019 for Establish Care, Hypertension, and ADHD  Previous PCP Dr Sanda Klein, now needs new PCP locally.  HPI   Major Depression, chronic recurrent - He says significant life stressor with COVID19 pandemic, previously sold house now he is in small apartment and awaiting getting into his new house being built, he says this has him down in mood - He is taking Zoloft 100mg  daily, he has not been on any other similar medications, on it for past 2-3 years from prior PCP  Lifestyle - He says some poor lifestyle lately. He has tried to improve regular exercise but causing some episodic back pain or soreness.  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 Hard time with meetings and specific tasks. He admits this is results in symptoms of mental fatigue and cloud. - He does not have significant family history from parents - they died when young, unknown fam history - He says his brother was dx with adult onset ADHD, and sister had history thyroid  OSA with CPAP He has prior Sleep study done in past, and dx with OSA, and has done well on CPAP, which has improved his daytime focus, previously he was   Low Testosterone In past diagnosed with Low T per Urology, followed by [facility] Dr Yves Dill, has improved. - On Clomid 50mg  daily  Morbid Obesity BMI >44 Abnormal weight gain Admits goal to improve lifestyle, needs to improve diet exercise.  Health Maintenance: Due for Flu Shot, declines today despite counseling on benefits   Depression screen [facility] 2/9 01/03/2019 11/30/2018 01/07/2018  Decreased Interest 1 0 0  Down, Depressed, Hopeless 1 0 0  PHQ - 2  Score 2 0 0  Altered sleeping 3 - 0  Tired, decreased energy 1 - 0  Change in appetite 3 - 1  Feeling bad or failure about yourself  1 - 0  Trouble concentrating 3 - 0  Moving slowly or fidgety/restless 0 - 0  Suicidal thoughts 0 - 0  PHQ-9 Score 13 - 1  Difficult doing work/chores Very difficult - Not difficult at all   GAD 7 : Generalized Anxiety Score 01/03/2019  Nervous, Anxious, on Edge 0  Control/stop worrying 0  Worry too much - different things 0  Trouble relaxing 0  Restless 1  Easily annoyed or irritable 1  Afraid - awful might happen 0  Total GAD 7 Score 2  Anxiety Difficulty Not difficult at all     Past Medical History:  Diagnosis Date  . Essential hypertension, benign 02/11/2015  . Fatigue   . Gout   . Hx of gout 08/05/2015  . Hypertension   . Obesity 02/11/2015  . OSA (obstructive sleep apnea)   . Sleep apnea   . Vitamin D deficiency disease    Past Surgical History:  Procedure Laterality Date  . APPENDECTOMY    . NASAL SEPTOPLASTY W/ TURBINOPLASTY Bilateral 03/23/2017   Procedure: NASAL SEPTOPLASTY WITH TURBINATE REDUCTION;  Surgeon: Beverly Gust, MD;  Location: [facility] ORS;  Service: ENT;  Laterality: Bilateral;   Social History   Socioeconomic History  . Marital status:  Married    Spouse name: Velna Hatchet  . Number of children: 2  . Years of education: 51  . Highest education level: Master's degree (e.g., MA, MS, MEng, MEd, MSW, MBA)  Occupational History  . 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 Occupation: retired    Comment: Engineer, structural  Social Needs  . Financial resource strain: Not hard at all  . Food insecurity    Worry: Never true    Inability: Never true  . Transportation needs    Medical: No    Non-medical: No  Tobacco Use  . Smoking status: Former Smoker    Packs/day: 1.00    Years: 10.00    Pack years: 10.00    Types: Cigarettes    Quit date: 04/06/2004    Years since quitting: 14.7  . Smokeless tobacco: Former  Systems developer    Types: Snuff, Sarina Ser    Quit date: 2018  . Tobacco comment: 1 can day for 15 years  Substance and Sexual Activity  . Alcohol use: Yes    Alcohol/week: 10.0 standard drinks    Types: 10 Standard drinks or equivalent per week    Comment: Social  . Drug use: No  . Sexual activity: Yes    Partners: Female  Lifestyle  . Physical activity    Days per week: 0 days    Minutes per session: 0 min  . Stress: Not at all  Relationships  . Social Herbalist on phone: Twice a week    Gets together: Once a week    Attends religious service: More than 4 times per year    Active member of club or organization: No    Attends meetings of clubs or organizations: Never    Relationship status: Married  . Intimate partner violence    Fear of current or ex partner: No    Emotionally abused: No    Physically abused: No    Forced sexual activity: No  Other Topics Concern  . Not on file  Social History Narrative  . Not on file   Family History  Problem Relation Age of Onset  . Alcohol abuse Mother   . Arthritis Mother   . Heart disease Mother   . Mental illness Mother   . Thyroid disease Mother   . Diabetes Father   . Heart attack Father   . Kidney disease Father        On Dialysis  . Heart disease Father   . Hyperlipidemia Father   . Thyroid disease Sister   . Hyperlipidemia Sister   . Hypertension Sister   . Lung disease Maternal Grandfather   . Heart attack Paternal Grandmother   . Cancer Neg Hx   . Stroke Neg Hx    Current Outpatient Medications on File Prior to Visit  Medication Sig  . acetaminophen (TYLENOL) 500 MG tablet Take 1,000-1,500 mg by mouth every 6 (six) hours as needed for moderate pain or headache.  . clomiPHENE (CLOMID) 50 MG tablet Take 50 mg by mouth daily.   . indomethacin (INDOCIN) 50 MG capsule Take 50 mg by mouth 3 (three) times daily with meals as needed. For gout flare PRN  . lisinopril (ZESTRIL) 10 MG tablet Take 1 tablet (10 mg total) by  mouth daily.  . Multiple Vitamins-Minerals (CENTRUM ULTRA MENS PO) Take by mouth.  . sertraline (ZOLOFT) 100 MG tablet Take 1 tablet (100 mg total) by mouth daily.   No current facility-administered medications  on file prior to visit.     Review of Systems Per HPI unless specifically indicated above      Objective:    BP 138/80 (BP Location: Left Arm, Cuff Size: Large)   Pulse 84   Temp 99.2 F (37.3 C) (Oral)   Resp 17   Ht 6\' 7"  (2.007 m)   Wt (!) 395 lb (179.2 kg)   BMI 44.50 kg/m   Wt Readings from Last 3 Encounters:  01/03/19 (!) 395 lb (179.2 kg)  03/10/18 (!) 377 lb 8 oz (171.2 kg)  01/07/18 (!) 374 lb 6.4 oz (169.8 kg)    Physical Exam Vitals signs and nursing note reviewed.  Constitutional:      General: He is not in acute distress.    Appearance: He is well-developed. He is not diaphoretic.     Comments: Well-appearing, comfortable, cooperative  HENT:     Head: Normocephalic and atraumatic.  Eyes:     General:        Right eye: No discharge.        Left eye: No discharge.     Conjunctiva/sclera: Conjunctivae normal.  Cardiovascular:     Rate and Rhythm: Normal rate.  Pulmonary:     Effort: Pulmonary effort is normal.  Skin:    General: Skin is warm and dry.     Findings: No erythema or rash.  Neurological:     Mental Status: He is alert and oriented to person, place, and time.  Psychiatric:        Behavior: Behavior normal.     Comments: Well groomed, good eye contact, normal speech and thoughts        Results for orders placed or performed in visit on 11/30/18  Novel Coronavirus, NAA ([facility])   Specimen: Oropharyngeal(OP) collection in vial transport medium   OROPHARYNGEA  TESTING  Result Value Ref Range   SARS-CoV-2, NAA Not Detected Not Detected      Assessment & Plan:   Problem List Items Addressed This Visit    Attention deficit hyperactivity disorder (ADHD), combined type - Primary    Clinically consistent with ADHD based on  current symptoms - >6 months or more, now affecting him more in new job, affecting his work and other aspects Complex history of depression/mood and prior OSA affecting daytime fatigue - now this is improved, and mood improved as well Fam history brother - adult onset ADHD  Plan - Proceed with handout info on ADHD testing, will need formal test - referral info give, if need order referral from [place] can contact us otherwise can schedule self referral to locations provided on AVS - Once formal testing- reevaluation for treatment, ideally can start treatment per Psychiatry and may need referral then we can determine longer term management plan - Possibly may not need medication if improved mental health mood upcoming with his move to new house, and maybe improve cognitive coping skills - Also will consider herbal natural remedy for focus, he has tried caffeine without good results  Follow-up as planned       Dyslipidemia    Due for lipid panel with upcoming physical Not on statin       Elevated serum creatinine    Due for repeat labs upcoming 4 weeks Prior elevated Cr Improve hydration      Essential hypertension, benign (Chronic)    Mildly elevated initial BP, repeat manual check improved to near normal range Some stress today, has fluctuating BP. - Home BP readings limited,  but wife can check  No known complications    Plan:  1. Continue current BP regimen Lisinopril 10mg  daily 2. Encourage improved lifestyle - low sodium diet, regular exercise 3. Continue monitor BP outside office, bring readings to next visit, if persistently >140/90 or new symptoms notify office sooner      Hx of gout    No recent gout flares Caution Indomethacin in future PRN use for gout      Morbid obesity with BMI of 40.0-44.9, adult ([place])    Abnormal elevated BMI >40 Encourage lifestyle diet and exercise      OSA on CPAP    Well controlled, chronic OSA on CPAP - Good adherence to CPAP nightly -  Continue current CPAP therapy, patient seems to be benefiting from therapy          No orders of the defined types were placed in this encounter.   Follow up plan: Return in about 4 weeks (around 01/31/2019) for Annual Physical.  Future labs for [facility] printed  Nobie Putnam, [facility] 01/03/2019, 10:18 AM

## 2019-01-03 NOTE — Patient Instructions (Addendum)
Thank you for coming to the office today.   ADHD  1. [facility]       [address]        [place], [place] [address]        Phone [phone] (Testing but no treatment)        2. [facility]       Ground Floor of the [facility] just off the lobby       [place], [place] [address]       Phone: [phone] (Testing AND treatment)   (CURRENTLY NOT [place]) - Initial Testing Lily Peer PHD, local Psychologist (insured, also some medicaid etc) [place], [place] [address] Phone: [phone]  -------------------------------------------------------------  PSYCHIATRY / [place]  (Treat but do not test)  [place] ([place] [facility]) Address: [address], [place], [place] [address] Hours: 8:30AM-5PM Phone: [phone]  External Referral  Cephus Shelling, MD  [place], [place] [address] Phone: [phone] Fax: [phone]   [facility] (All ages) [address] [place], [id number] Phone: [phone] (Option 1) [email or web address]  ----------------------------------------------------------------  [facility]) [place] [address], [place], [place] [address] Phone: [phone]  [employer], available walk-in 9am-4pm M-F [place], [place] [address] Hours: [place] (M-F, walk in available) [phone]   -----------------------------------------------   DUE for FASTING BLOOD WORK (no food or drink after midnight before the lab appointment, only water or coffee without cream/sugar on the morning of)  [facility] 3-4 weeks    Please schedule a Follow-up Appointment to: Return in about 4 weeks (around 01/31/2019) for Annual Physical.  If you  have any other questions or concerns, please feel free to call the office or send a message through [place]. You may also schedule an earlier appointment if necessary.  Additionally, you may be receiving a survey about your experience at our office within a few days to 1 week by e-mail or mail. We value your feedback.  Nobie Putnam, DO [place]

## 2019-01-03 NOTE — Assessment & Plan Note (Signed)
Due for repeat labs upcoming 4 weeks Prior elevated Cr Improve hydration

## 2019-01-03 NOTE — Assessment & Plan Note (Signed)
Well controlled, chronic OSA on CPAP - Good adherence to CPAP nightly - Continue current CPAP therapy, patient seems to be benefiting from therapy  

## 2019-01-09 ENCOUNTER — Encounter: Payer: BLUE CROSS/BLUE SHIELD | Admitting: Family Medicine

## 2019-01-27 ENCOUNTER — Encounter: Payer: BC Managed Care – PPO | Admitting: Family Medicine

## 2019-02-01 ENCOUNTER — Encounter: Payer: BC Managed Care – PPO | Admitting: Family Medicine

## 2019-02-10 ENCOUNTER — Other Ambulatory Visit: Payer: Self-pay | Admitting: Family Medicine

## 2019-02-10 DIAGNOSIS — Z8739 Personal history of other diseases of the musculoskeletal system and connective tissue: Secondary | ICD-10-CM

## 2019-02-10 NOTE — Telephone Encounter (Signed)
Requested medication (s) are due for refill today: yes  Requested medication (s) are on the active medication list: yes  Last refill:  12/08/2018  Future visit scheduled: no  Notes to clinic: last filled by different provider    Requested Prescriptions  Pending Prescriptions Disp Refills   indomethacin (INDOCIN) 50 MG capsule [Pharmacy Med Name: INDOMETHACIN 50 MG CAPSULE] 21 capsule 0    Sig: Take 1 capsule (50 mg total) by mouth 3 (three) times daily as needed. Take with food; may cause GI bleed     Analgesics:  NSAIDS Failed - 02/10/2019  2:27 PM      Failed - Cr in normal range and within 360 days    Creat  Date Value Ref Range Status  11/02/2016 1.29 0.60 - 1.35 mg/dL Final   Creatinine, Ser  Date Value Ref Range Status  12/16/2017 1.30 (H) 0.76 - 1.27 mg/dL Final         Failed - HGB in normal range and within 360 days    Hemoglobin  Date Value Ref Range Status  08/10/2016 15.2 13.2 - 17.1 g/dL Final  02/07/2015 15.5 12.6 - 17.7 g/dL Final         Passed - Patient is not pregnant      Passed - Valid encounter within last 12 months    Recent Outpatient Visits          2 months ago Suspected Covid-19 Virus Infection   [place], NP   11 months ago Upper respiratory tract infection, unspecified type   [facility] [name], NP   1 year ago Preventative health care   [facility] Lada, Satira Anis, MD   1 year ago Dyslipidemia   [place], Satira Anis, MD   2 years ago Encounter for completion of form with patient   [facility] Lada, Satira Anis, MD

## 2019-02-13 ENCOUNTER — Other Ambulatory Visit: Payer: Self-pay | Admitting: Family Medicine

## 2019-03-23 ENCOUNTER — Telehealth: Payer: Self-pay | Admitting: Family Medicine

## 2019-03-23 DIAGNOSIS — R7309 Other abnormal glucose: Secondary | ICD-10-CM

## 2019-03-23 DIAGNOSIS — Z0000 Encounter for general adult medical examination without abnormal findings: Secondary | ICD-10-CM

## 2019-03-23 DIAGNOSIS — E785 Hyperlipidemia, unspecified: Secondary | ICD-10-CM

## 2019-03-23 DIAGNOSIS — G4733 Obstructive sleep apnea (adult) (pediatric): Secondary | ICD-10-CM

## 2019-03-23 DIAGNOSIS — I10 Essential (primary) hypertension: Secondary | ICD-10-CM

## 2019-03-23 NOTE — Telephone Encounter (Signed)
Patient has lab order for [facility] but [facility] lab changed just need to cosign. Thank you.

## 2019-03-24 ENCOUNTER — Other Ambulatory Visit: Payer: BC Managed Care – PPO

## 2019-04-04 DIAGNOSIS — G4733 Obstructive sleep apnea (adult) (pediatric): Secondary | ICD-10-CM | POA: Diagnosis not present

## 2019-04-05 ENCOUNTER — Encounter: Payer: BC Managed Care – PPO | Admitting: Family Medicine

## 2019-04-05 ENCOUNTER — Other Ambulatory Visit: Payer: Self-pay

## 2019-06-30 ENCOUNTER — Other Ambulatory Visit: Payer: Self-pay | Admitting: Family Medicine

## 2019-06-30 DIAGNOSIS — Z8739 Personal history of other diseases of the musculoskeletal system and connective tissue: Secondary | ICD-10-CM

## 2019-09-29 ENCOUNTER — Other Ambulatory Visit: Payer: Self-pay | Admitting: Family Medicine

## 2019-09-29 NOTE — Telephone Encounter (Signed)
Not a patient here. 

## 2019-10-19 ENCOUNTER — Other Ambulatory Visit: Payer: Self-pay

## 2019-10-19 ENCOUNTER — Telehealth: Payer: Self-pay | Admitting: Family Medicine

## 2019-10-19 DIAGNOSIS — E785 Hyperlipidemia, unspecified: Secondary | ICD-10-CM | POA: Diagnosis not present

## 2019-10-19 DIAGNOSIS — Z0000 Encounter for general adult medical examination without abnormal findings: Secondary | ICD-10-CM

## 2019-10-19 DIAGNOSIS — R7309 Other abnormal glucose: Secondary | ICD-10-CM | POA: Diagnosis not present

## 2019-10-19 NOTE — Telephone Encounter (Signed)
Signed.

## 2019-10-20 LAB — CBC WITH DIFFERENTIAL/PLATELET
Absolute Monocytes: 441 cells/uL (ref 200–950)
Basophils Absolute: 70 cells/uL (ref 0–200)
Basophils Relative: 1.2 %
Eosinophils Absolute: 220 cells/uL (ref 15–500)
Eosinophils Relative: 3.8 %
HCT: 46 % (ref 38.5–50.0)
Hemoglobin: 15 g/dL (ref 13.2–17.1)
Lymphs Abs: 2175 cells/uL (ref 850–3900)
MCH: 30.1 pg (ref 27.0–33.0)
MCHC: 32.6 g/dL (ref 32.0–36.0)
MCV: 92.4 fL (ref 80.0–100.0)
MPV: 9.7 fL (ref 7.5–12.5)
Monocytes Relative: 7.6 %
Neutro Abs: 2894 cells/uL (ref 1500–7800)
Neutrophils Relative %: 49.9 %
Platelets: 182 10*3/uL (ref 140–400)
RBC: 4.98 10*6/uL (ref 4.20–5.80)
RDW: 14 % (ref 11.0–15.0)
Total Lymphocyte: 37.5 %
WBC: 5.8 10*3/uL (ref 3.8–10.8)

## 2019-10-20 LAB — COMPLETE METABOLIC PANEL WITH GFR
AG Ratio: 2 (calc) (ref 1.0–2.5)
ALT: 30 U/L (ref 9–46)
AST: 24 U/L (ref 10–40)
Albumin: 4.5 g/dL (ref 3.6–5.1)
Alkaline phosphatase (APISO): 60 U/L (ref 36–130)
BUN: 15 mg/dL (ref 7–25)
CO2: 26 mmol/L (ref 20–32)
Calcium: 9.3 mg/dL (ref 8.6–10.3)
Chloride: 103 mmol/L (ref 98–110)
Creat: 1.23 mg/dL (ref 0.60–1.35)
GFR, Est African American: 83 mL/min/{1.73_m2} (ref >=60)
GFR, Est Non African American: 71 mL/min/{1.73_m2} (ref >=60)
Globulin: 2.3 g/dL (calc) (ref 1.9–3.7)
Glucose, Bld: 95 mg/dL (ref 65–99)
Potassium: 4.6 mmol/L (ref 3.5–5.3)
Sodium: 137 mmol/L (ref 135–146)
Total Bilirubin: 0.5 mg/dL (ref 0.2–1.2)
Total Protein: 6.8 g/dL (ref 6.1–8.1)

## 2019-10-20 LAB — LIPID PANEL
Cholesterol: 215 mg/dL — ABNORMAL HIGH (ref <200)
HDL: 53 mg/dL (ref >=40)
LDL Cholesterol (Calc): 121 mg/dL (calc) — ABNORMAL HIGH
Non-HDL Cholesterol (Calc): 162 mg/dL (calc) — ABNORMAL HIGH (ref <130)
Total CHOL/HDL Ratio: 4.1 (calc) (ref <5.0)
Triglycerides: 267 mg/dL — ABNORMAL HIGH (ref <150)

## 2019-10-20 LAB — HEMOGLOBIN A1C
Hgb A1c MFr Bld: 5.5 % of total Hgb (ref <5.7)
Mean Plasma Glucose: 111 (calc)
eAG (mmol/L): 6.2 (calc)

## 2019-10-20 LAB — TSH: TSH: 4.01 mIU/L (ref 0.40–4.50)

## 2019-10-24 ENCOUNTER — Encounter: Payer: Self-pay | Admitting: Family Medicine

## 2019-10-24 ENCOUNTER — Ambulatory Visit (INDEPENDENT_AMBULATORY_CARE_PROVIDER_SITE_OTHER): Payer: BC Managed Care – PPO | Admitting: Family Medicine

## 2019-10-24 ENCOUNTER — Other Ambulatory Visit: Payer: Self-pay

## 2019-10-24 VITALS — BP 158/110 | HR 63 | Temp 98.0°F | Resp 16 | Ht 79.0 in | Wt 399.0 lb

## 2019-10-24 DIAGNOSIS — I10 Essential (primary) hypertension: Secondary | ICD-10-CM

## 2019-10-24 DIAGNOSIS — F1021 Alcohol dependence, in remission: Secondary | ICD-10-CM | POA: Insufficient documentation

## 2019-10-24 DIAGNOSIS — Z0000 Encounter for general adult medical examination without abnormal findings: Secondary | ICD-10-CM | POA: Diagnosis not present

## 2019-10-24 DIAGNOSIS — E785 Hyperlipidemia, unspecified: Secondary | ICD-10-CM

## 2019-10-24 DIAGNOSIS — F331 Major depressive disorder, recurrent, moderate: Secondary | ICD-10-CM | POA: Insufficient documentation

## 2019-10-24 DIAGNOSIS — F1020 Alcohol dependence, uncomplicated: Secondary | ICD-10-CM | POA: Insufficient documentation

## 2019-10-24 DIAGNOSIS — Z9989 Dependence on other enabling machines and devices: Secondary | ICD-10-CM

## 2019-10-24 DIAGNOSIS — Z6841 Body Mass Index (BMI) 40.0 and over, adult: Secondary | ICD-10-CM

## 2019-10-24 DIAGNOSIS — G4733 Obstructive sleep apnea (adult) (pediatric): Secondary | ICD-10-CM | POA: Diagnosis not present

## 2019-10-24 DIAGNOSIS — R7989 Other specified abnormal findings of blood chemistry: Secondary | ICD-10-CM

## 2019-10-24 MED ORDER — LISINOPRIL 10 MG PO TABS: 10.0000 mg | ORAL_TABLET | Freq: Every day | ORAL | 3 refills | Status: DC

## 2019-10-24 NOTE — Assessment & Plan Note (Signed)
See A&P 

## 2019-10-24 NOTE — Assessment & Plan Note (Signed)
Well controlled, chronic OSA on CPAP - Good adherence to CPAP nightly - Continue current CPAP therapy, patient seems to be benefiting from therapy  

## 2019-10-24 NOTE — Assessment & Plan Note (Signed)
Elevated BP, off med 3 weeks - Home BP readings limited, but wife can check more, had been controlled No known complications    Plan:  1. Continue current BP regimen Lisinopril 10mg  daily - refill 90 day [address] 2. Encourage improved lifestyle - low sodium diet, regular exercise 3. Continue monitor BP outside office, bring readings to next visit, if persistently >140/90 or new symptoms notify office sooner

## 2019-10-24 NOTE — Assessment & Plan Note (Signed)
Weight gain, BMI >44 Discussion on lifestyle diet exercise weight loss

## 2019-10-24 NOTE — Assessment & Plan Note (Signed)
Improved. Seems at baseline No CKD

## 2019-10-24 NOTE — Patient Instructions (Addendum)
Thank you for coming to the office today.  Restart Lisinopril 10mg  daily, keep track of BP  Look into these options.  We can consider managing the mood more now, and then discuss other options for alcohol with specialist.  PSYCHIATRY / [place] [address], [place] [address] Johnnette Litter P: [phone]  ---------------------------------------  [employer], available walk-in 9am-4pm M-F [place], [place] [address] Hours: [place] (M-F, walk in available) [phone]  Alcoholics Anonymous (AA)  [place] [address] ([place]) Web Site: FactoringRate.ca Answering Service: [phone]  -------------------  PSYCHIATRY / [place]  Internal Referral [place] ([place] [facility]) Address: [place] [address], [place], [place] [address] Hours: 8:30AM-5PM Phone: [phone]   Cephus Shelling, MD [place], [place] [address] Phone: [phone]  [employer] (All ages) [address] [place], [id number] Phone: [phone] (Option 1) [email or web address]  [facility]) [place] [address] [place], [place] [address] Phone: [phone]  [facility], available walk-in 9am-4pm M-F [place], [place] [address] Hours: [place] (M-F, walk in available) [phone]   ------  Substance Use  [facility] [place], [place] [address] Ph: [phone]  Inpatient Psych Stabilization, Voluntary Inpatient - Adult >18 all ADLs Partial Hospitalization Intensive Outpatient Med management bridge to Detox / Substance abuse  ------------------------------------------   Please schedule a Follow-up Appointment to: Return in about 2 weeks (around 11/07/2019) for 1-2  week follow-up Mood / Alcohol.  If you have any other questions or concerns, please feel free to call the office or send a message through [place]. You may also schedule an earlier appointment if necessary.  Additionally, you may be receiving a survey about your experience at our office within a few days to 1 week by e-mail or mail. We value your feedback.  Nobie Putnam, DO [place]

## 2019-10-24 NOTE — Progress Notes (Signed)
Subjective:    Patient ID: Darren Robinson, male    DOB: 1975/08/13, 43 y.o.   MRN: [id number]  Darren Robinson is a 44 y.o. male presenting on 10/24/2019 for Annual Exam   HPI   Here for Annual Physical and Lab Review.  CHRONIC HTN Elevated Creatinine Lab result showed improved Creatinine, prior results 3 years reviewed. Reports elevated BP past 3 weeks off med, was controlling it. Also admits alcohol see below Current Meds - Lisinopril 10mg  daily, off med, needs re order   Reports good compliance, did not take med today Tolerating well, w/o complaints. Lifestyle: - Diet: admits poor diet at times - Exercise: active physically on farm Denies CP, dyspnea, HA, edema, dizziness / lightheadedness  Father DM, Dialysis, MI   Major Depression, chronic recurrent Alcohol Dependence Chronic history of depression, now with persistent moderate to severe symptoms He states overall uncertain exact stressors, some mental health in family He says has been chronically drinking alcohol now seems to use it more as a "self treatment" with drinking regularly to help improve mood, but then he DOES feel happy and satisfied with life and his job and his family and his farm, so he says feels some conflict on what is causing his mental health issue. - He prefers to drink liquor with bourbon, most days after work, often drinks to get drunk at times. Not affecting his job or family but he says it is becoming a problem more now. Never quit before, would like to return to talk more - He is taking Zoloft 100mg  daily, he has not been on any other similar medications, on it for past 2-3 years from prior PCP Mother Alcoholic  HYPERLIPIDEMIA: - Reports no concerns. 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 Hard time with meetings and specific tasks. He admits this is results in symptoms of mental fatigue and cloud. - He does not have significant family history from parents - they died when young, unknown fam history - He says his brother was dx with adult onset ADHD, and sister had history thyroid  OSA with CPAP He has prior Sleep study done in past, and dx with OSA, and has done well on CPAP, which has improved his daytime focus. He uses it every night with good results. He is benefiting from therapy.  Low Testosterone In past diagnosed with Low T per Urology, followed by [facility] Dr Yves Dill, has improved. - On Clomid 50mg  daily  Morbid Obesity BMI >44 Abnormal weight gain Admits goal to improve lifestyle, needs to improve diet exercise.  Health Maintenance: He got COVID19 last year. Says decline vaccine now.  Depression screen [facility] 2/9 10/24/2019 01/03/2019 11/30/2018  Decreased Interest 2 1 0  Down, Depressed, Hopeless 1 1 0  PHQ - 2 Score 3 2 0  Altered sleeping 3 3 -  Tired, decreased energy 3 1 -  Change in appetite 3 3 -  Feeling bad or failure about yourself  2 1 -  Trouble concentrating 3 3 -  Moving slowly or fidgety/restless 2 0 -  Suicidal thoughts 0 0 -  PHQ-9 Score 19 13 -  Difficult doing work/chores Somewhat difficult Very difficult -  Past Medical History:  Diagnosis Date  . Essential hypertension, benign 02/11/2015  . Fatigue   . Gout   . Hx of gout 08/05/2015  . Hypertension   . Obesity 02/11/2015  . OSA (obstructive sleep apnea)   . Sleep apnea   . Vitamin D deficiency disease    Past Surgical History:  Procedure Laterality Date  . APPENDECTOMY    . NASAL SEPTOPLASTY W/ TURBINOPLASTY Bilateral 03/23/2017   Procedure: NASAL SEPTOPLASTY WITH TURBINATE REDUCTION;  Surgeon: Beverly Gust, MD;  Location: [facility] ORS;  Service: ENT;  Laterality: Bilateral;   Social History    Socioeconomic History  . Marital status: Married    Spouse name: Velna Hatchet  . Number of children: 2  . Years of education: 68  . Highest education level: Master's degree (e.g., MA, MS, MEng, MEd, MSW, MBA)  Occupational History  . 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 Occupation: retired    Comment: Engineer, structural  Tobacco Use  . Smoking status: Former Smoker    Packs/day: 1.00    Years: 10.00    Pack years: 10.00    Types: Cigarettes    Quit date: 04/06/2004    Years since quitting: 15.5  . Smokeless tobacco: Former Systems developer    Types: Snuff, Sarina Ser    Quit date: 2018  . Tobacco comment: 1 can day for 15 years  Vaping Use  . Vaping Use: Never used  Substance and Sexual Activity  . Alcohol use: Yes    Alcohol/week: 10.0 standard drinks    Types: 10 Standard drinks or equivalent per week    Comment: Social  . Drug use: No  . Sexual activity: Yes    Partners: Female  Other Topics Concern  . Not on file  Social History Narrative  . Not on file   Social Determinants of Health   Financial Resource Strain:   . Difficulty of Paying Living Expenses:   Food Insecurity:   . Worried About Charity fundraiser in the Last Year:   . Arboriculturist in the Last Year:   Transportation Needs:   . Film/video editor (Medical):   Marland Kitchen Lack of Transportation (Non-Medical):   Physical Activity:   . Days of Exercise per Week:   . Minutes of Exercise per Session:   Stress:   . Feeling of Stress :   Social Connections:   . Frequency of Communication with Friends and Family:   . Frequency of Social Gatherings with Friends and Family:   . Attends Religious Services:   . Active Member of Clubs or Organizations:   . Attends Archivist Meetings:   Marland Kitchen Marital Status:   Intimate Partner Violence:   . Fear of Current or Ex-Partner:   . Emotionally Abused:   Marland Kitchen Physically Abused:   . Sexually Abused:    Family History  Problem Relation Age of Onset  . Alcohol abuse  Mother   . Arthritis Mother   . Heart disease Mother   . Mental illness Mother   . Thyroid disease Mother   . Diabetes Father   . Heart attack Father   . Kidney disease Father        On Dialysis  . Heart disease Father   . Hyperlipidemia Father   . Thyroid disease Sister   . Hyperlipidemia Sister   . Hypertension Sister   . Lung disease Maternal Grandfather   . Heart attack Paternal Grandmother   . Cancer Neg  Hx   . Stroke Neg Hx    Current Outpatient Medications on File Prior to Visit  Medication Sig  . acetaminophen (TYLENOL) 500 MG tablet Take 1,000-1,500 mg by mouth every 6 (six) hours as needed for moderate pain or headache.  . clomiPHENE (CLOMID) 50 MG tablet Take 50 mg by mouth daily.   . indomethacin (INDOCIN) 50 MG capsule Take 1 capsule (50 mg total) by mouth 3 (three) times daily as needed. Take with food; may cause GI bleed  . Multiple Vitamins-Minerals (CENTRUM ULTRA MENS PO) Take by mouth.  . sertraline (ZOLOFT) 100 MG tablet Take 1 tablet (100 mg total) by mouth daily.   No current facility-administered medications on file prior to visit.    Review of Systems  Constitutional: Negative for activity change, appetite change, chills, diaphoresis, fatigue and fever.  HENT: Negative for congestion and hearing loss.   Eyes: Negative for visual disturbance.  Respiratory: Negative for apnea, cough, chest tightness, shortness of breath and wheezing.   Cardiovascular: Negative for chest pain, palpitations and leg swelling.  Gastrointestinal: Negative for abdominal pain, anal bleeding, blood in stool, constipation, diarrhea, nausea and vomiting.  Endocrine: Negative for cold intolerance.  Genitourinary: Negative for difficulty urinating, dysuria, frequency and hematuria.  Musculoskeletal: Negative for arthralgias, back pain and neck pain.  Skin: Negative for rash.  Allergic/Immunologic: Negative for environmental allergies.  Neurological: Negative for dizziness, weakness,  light-headedness, numbness and headaches.  Hematological: Negative for adenopathy.  Psychiatric/Behavioral: Negative for behavioral problems, dysphoric mood and sleep disturbance. The patient is not nervous/anxious.    Per HPI unless specifically indicated above      Objective:    BP (!) 158/110   Pulse 63   Temp 98 F (36.7 C) (Temporal)   Resp 16   Ht 6\' 7"  (2.007 m)   Wt (!) 399 lb (181 kg)   SpO2 99%   BMI 44.95 kg/m   Wt Readings from Last 3 Encounters:  10/24/19 (!) 399 lb (181 kg)  01/03/19 (!) 395 lb (179.2 kg)  03/10/18 (!) 377 lb 8 oz (171.2 kg)    Physical Exam Vitals and nursing note reviewed.  Constitutional:      General: He is not in acute distress.    Appearance: He is well-developed. He is obese. He is not diaphoretic.     Comments: Well-appearing, comfortable, cooperative  HENT:     Head: Normocephalic and atraumatic.  Eyes:     General:        Right eye: No discharge.        Left eye: No discharge.     Conjunctiva/sclera: Conjunctivae normal.     Pupils: Pupils are equal, round, and reactive to light.  Neck:     Thyroid: No thyromegaly.     Vascular: No carotid bruit.  Cardiovascular:     Rate and Rhythm: Normal rate and regular rhythm.     Heart sounds: Normal heart sounds. No murmur heard.   Pulmonary:     Effort: Pulmonary effort is normal. No respiratory distress.     Breath sounds: Normal breath sounds. No wheezing or rales.  Abdominal:     General: Bowel sounds are normal. There is no distension.     Palpations: Abdomen is soft. There is no mass.     Tenderness: There is no abdominal tenderness.  Musculoskeletal:        General: No tenderness. Normal range of motion.     Cervical back: Normal range of motion and neck  supple.     Right lower leg: No edema.     Left lower leg: No edema.     Comments: Upper / Lower Extremities: - Normal muscle tone, strength bilateral upper extremities 5/5, lower extremities 5/5  R index fingernail  with thickened skin at tip of nailbed, nail cut down. No wart seen  Lymphadenopathy:     Cervical: No cervical adenopathy.  Skin:    General: Skin is warm and dry.     Findings: No erythema or rash.  Neurological:     Mental Status: He is alert and oriented to person, place, and time.     Comments: Distal sensation intact to light touch all extremities  Psychiatric:        Behavior: Behavior normal.     Comments: Well groomed, good eye contact, normal speech and thoughts       Results for orders placed or performed in visit on 10/19/19  CBC with Differential/Platelet  Result Value Ref Range   WBC 5.8 3.8 - 10.8 Thousand/uL   RBC 4.98 4.20 - 5.80 Million/uL   Hemoglobin 15.0 13.2 - 17.1 g/dL   HCT 46.0 38 - 50 %   MCV 92.4 80.0 - 100.0 fL   MCH 30.1 27.0 - 33.0 pg   MCHC 32.6 32.0 - 36.0 g/dL   RDW 14.0 11.0 - 15.0 %   Platelets 182 140 - 400 Thousand/uL   MPV 9.7 7.5 - 12.5 fL   Neutro Abs 2,894 1,500 - 7,800 cells/uL   Lymphs Abs 2,175 850 - 3,900 cells/uL   Absolute Monocytes 441 200 - 950 cells/uL   Eosinophils Absolute 220 15 - 500 cells/uL   Basophils Absolute 70 0 - 200 cells/uL   Neutrophils Relative % 49.9 %   Total Lymphocyte 37.5 %   Monocytes Relative 7.6 %   Eosinophils Relative 3.8 %   Basophils Relative 1.2 %  Hemoglobin A1c  Result Value Ref Range   Hgb A1c MFr Bld 5.5 <5.7 % of total Hgb   Mean Plasma Glucose 111 (calc)   eAG (mmol/L) 6.2 (calc)  Lipid panel  Result Value Ref Range   Cholesterol 215 (H) <200 mg/dL   HDL 53 > OR = 40 mg/dL   Triglycerides 267 (H) <150 mg/dL   LDL Cholesterol (Calc) 121 (H) mg/dL (calc)   Total CHOL/HDL Ratio 4.1 <5.0 (calc)   Non-HDL Cholesterol (Calc) 162 (H) <130 mg/dL (calc)  COMPLETE METABOLIC PANEL WITH GFR  Result Value Ref Range   Glucose, Bld 95 65 - 99 mg/dL   BUN 15 7 - 25 mg/dL   Creat 1.23 0.60 - 1.35 mg/dL   GFR, Est Non African American 71 > OR = 60 mL/min/1.14m2   GFR, Est African American 83 >  OR = 60 mL/min/1.2m2   BUN/Creatinine Ratio NOT APPLICABLE 6 - 22 (calc)   Sodium 137 135 - 146 mmol/L   Potassium 4.6 3.5 - 5.3 mmol/L   Chloride 103 98 - 110 mmol/L   CO2 26 20 - 32 mmol/L   Calcium 9.3 8.6 - 10.3 mg/dL   Total Protein 6.8 6.1 - 8.1 g/dL   Albumin 4.5 3.6 - 5.1 g/dL   Globulin 2.3 1.9 - 3.7 g/dL (calc)   AG Ratio 2.0 1.0 - 2.5 (calc)   Total Bilirubin 0.5 0.2 - 1.2 mg/dL   Alkaline phosphatase (APISO) 60 36 - 130 U/L   AST 24 10 - 40 U/L   ALT 30 9 - 46 U/L  TSH  Result Value Ref Range   TSH 4.01 0.40 - 4.50 mIU/L      Assessment & Plan:   Problem List Items Addressed This Visit    Uncomplicated alcohol dependence ([place])    See A&P      OSA on CPAP    Well controlled, chronic OSA on CPAP - Good adherence to CPAP nightly - Continue current CPAP therapy, patient seems to be benefiting from therapy       Morbid obesity with BMI of 40.0-44.9, adult ([place])    Weight gain, BMI >44 Discussion on lifestyle diet exercise weight loss      Major depressive disorder, recurrent, moderate (HCC)    See A&P      Essential hypertension, benign (Chronic)    Elevated BP, off med 3 weeks - Home BP readings limited, but wife can check more, had been controlled No known complications    Plan:  1. Continue current BP regimen Lisinopril 10mg  daily - refill 90 day [place] [address] 2. Encourage improved lifestyle - low sodium diet, regular exercise 3. Continue monitor BP outside office, bring readings to next visit, if persistently >140/90 or new symptoms notify office sooner      Relevant Medications   lisinopril (ZESTRIL) 10 MG tablet   Elevated serum creatinine    Improved. Seems at baseline No CKD      Dyslipidemia    Elevated LDL some poor lifestyle Last lipid panel 10/2019  Plan: Encourage improved lifestyle - low carb/cholesterol, reduce portion size, continue improving regular exercise Follow-up yearly        Other Visit Diagnoses    Annual physical  exam    -  Primary      Updated Health Maintenance information Reviewed recent lab results with patient Encouraged improvement to lifestyle with diet and exercise - Goal of weight loss  #Major Depression, recurrent moderate #Alcohol Dependence Discussion today on these problems today in detail, however he will need to return soon to come up with actual treatment plan. We reviewed importance of scaling back alcohol safely and future detox and other treatment options, primarily we need to treat his underlying mood to help his substance problem and seek out mental health resources next, handout given with list he can check cost/coverage.  Meds ordered this encounter  Medications  . lisinopril (ZESTRIL) 10 MG tablet    Sig: Take 1 tablet (10 mg total) by mouth daily.    Dispense:  90 tablet    Refill:  3     Follow up plan: Return in about 2 weeks (around 11/07/2019) for 1-2 week follow-up Mood / Alcohol.   Future plan to check blood [facility] in 6 months.  Nobie Putnam, [facility] 10/24/2019, 9:20 AM

## 2019-10-24 NOTE — Assessment & Plan Note (Signed)
Elevated LDL some poor lifestyle Last lipid panel 10/2019  Plan: Encourage improved lifestyle - low carb/cholesterol, reduce portion size, continue improving regular exercise Follow-up yearly

## 2019-11-13 ENCOUNTER — Encounter: Payer: Self-pay | Admitting: Family Medicine

## 2019-11-13 ENCOUNTER — Other Ambulatory Visit: Payer: Self-pay

## 2019-11-13 ENCOUNTER — Ambulatory Visit (INDEPENDENT_AMBULATORY_CARE_PROVIDER_SITE_OTHER): Payer: BC Managed Care – PPO | Admitting: Family Medicine

## 2019-11-13 VITALS — BP 136/86 | HR 63 | Temp 97.1°F | Resp 16 | Ht 79.0 in | Wt 397.0 lb

## 2019-11-13 DIAGNOSIS — F331 Major depressive disorder, recurrent, moderate: Secondary | ICD-10-CM | POA: Diagnosis not present

## 2019-11-13 DIAGNOSIS — M10471 Other secondary gout, right ankle and foot: Secondary | ICD-10-CM

## 2019-11-13 DIAGNOSIS — I10 Essential (primary) hypertension: Secondary | ICD-10-CM

## 2019-11-13 DIAGNOSIS — Z8739 Personal history of other diseases of the musculoskeletal system and connective tissue: Secondary | ICD-10-CM | POA: Diagnosis not present

## 2019-11-13 DIAGNOSIS — F902 Attention-deficit hyperactivity disorder, combined type: Secondary | ICD-10-CM | POA: Diagnosis not present

## 2019-11-13 DIAGNOSIS — F1020 Alcohol dependence, uncomplicated: Secondary | ICD-10-CM

## 2019-11-13 MED ORDER — INDOMETHACIN 50 MG PO CAPS: ORAL_CAPSULE | ORAL | 2 refills | Status: DC

## 2019-11-13 NOTE — Patient Instructions (Addendum)
Thank you for coming to the office today.  [place] [place], [place] [address] [place]: [phone] F: [place] Address: [address] [place], [place] [address] [email or web address] Phone: [phone]  Refilled Indomethicin, use as needed for gout flares.  Continue Sertraline.  ----------------------------------------------  [facility], check online to see if they can schedule.   [place]:  [facility]) [place] [address]  Hours: Monday - Sunday 8:00am to 12:00pm  COVID-19 Vaccines By Appointment Only  Sign up for [place] List  [email or web address] or text "VACCINE" to [phone] or call [phone]   Please schedule a Follow-up Appointment to: Return in about 3 months (around 02/13/2020) for 3 month Follow-up Psych/Mood PHQ, ADHD.  If you have any other questions or concerns, please feel free to call the office or send a message through [place]. You may also schedule an earlier appointment if necessary.  Additionally, you may be receiving a survey about your experience at our office within a few days to 1 week by e-mail or mail. We value your feedback.  Nobie Putnam, DO [place]

## 2019-11-13 NOTE — Assessment & Plan Note (Signed)
Active chronic depression recurrent moderate to severe Complex history of depression/mood and prior OSA affecting daytime fatigue Alcohol dependence ADHD Fam history brother - adult onset ADHD  Plan Continue Sertraline 100mg  daily - seems to help control irritability but does not resolve his mood problem Referral to Psychiatry ([place]) for further diagnostic testing, med management of both ADHD and Mood / Substance-Alcohol Counseling on limiting and caution with alcohol as form of self treatment, discussed future goals to taper/reduce  Follow-up as planned after seen Psych

## 2019-11-13 NOTE — Assessment & Plan Note (Signed)
Well-controlled HTN - Home BP readings improved   No known complications    Plan:  1. Continue current BP regimen - Lisinopril 10mg  daily 2. Encourage improved lifestyle - low sodium diet, regular exercise 3. Continue monitor BP outside office, bring readings to next visit, if persistently >140/90 or new symptoms notify office sooner

## 2019-11-13 NOTE — Assessment & Plan Note (Signed)
See A&P Mood

## 2019-11-13 NOTE — Progress Notes (Signed)
Subjective:    Patient ID: Darren Robinson, male    DOB: 1975/12/24, 44 y.o.   MRN: [id number]  Darren Robinson is a 44 y.o. male presenting on 11/13/2019 for ADHD and Depression   HPI   Major Depression, chronic recurrent ADHD Alcohol Dependence - Last visit with me 10/24/19, for physical, he reported same issues, addressed at that time, treated with counseling and future plans for psychiatry, see prior notes for background information. - Interval update with he has tried to monitor his alcohol intake, and be aware of how often and how much he is drinking, mostly reports it as social drinking after work or weekends, and recently he has been busy and not drinking as much which has been good - Today patient reports he is doing well, on current med Sertraline 100mg  daily, it helps control his irritability, but does not resolve it. 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 Hard time with meetings and specific tasks. He admits this is results in symptoms of mental fatigue and cloud. - He does not have significant family history from parents - they died when young, unknown fam history - He says his brother was dx with adult onset ADHD, and sister had history thyroid - Asking about referral to Psychiatry today. We discussed location in [place] and he would like to proceed, also he is considering Delrae Alfred, therapist in [place] but does not specialize in ADHD but was recommended as her husband is a Engineer, structural  Regarding alcohol - He prefers to drink liquor with bourbon, most days after work, often drinks to get drunk at times. Not affecting his job or family but he says it is becoming  a problem more now  Right Toe/Foot - Gout Flare, Acute Reports recent gout flare, onset last week he was doing a lot of walking and hiking, and he was at beach and had some food triggers / seafood. He did have his indomethacin with him usually treats it, needs refill today.  CHRONIC HTN: Reports improved now back on medicine, last visit had run out of med Current Meds - Lisinopril 10mg  daily   Reports good compliance, took meds today. Tolerating well, w/o complaints. Denies CP, dyspnea, HA, edema, dizziness / lightheadedness   Health Maintenance: Due for COVID vaccine he will work on scheduling this now.  Depression screen [facility] 2/9 11/13/2019 10/24/2019 01/03/2019  Decreased Interest 2 2 1   Down, Depressed, Hopeless 1 1 1   PHQ - 2 Score 3 3 2   Altered sleeping 2 3 3   Tired, decreased energy 3 3 1   Change in appetite 3 3 3   Feeling bad or failure about yourself  2 2 1   Trouble concentrating 3 3 3   Moving slowly or fidgety/restless 1 2 0  Suicidal thoughts 0 0 0  PHQ-9 Score 17 19 13   Difficult doing work/chores Somewhat difficult Somewhat difficult Very difficult  Some recent data might be hidden   GAD 7 : Generalized Anxiety Score 01/03/2019  Nervous, Anxious, on Edge 0  Control/stop worrying 0  Worry too much - different things 0  Trouble relaxing 0  Restless 1  Easily annoyed or irritable 1  Afraid - awful might happen 0  Total GAD 7 Score 2  Anxiety Difficulty Not difficult at all     Social History   Tobacco Use  . Smoking status: Former Smoker    Packs/day: 1.00    Years: 10.00    Pack years: 10.00    Types: Cigarettes    Quit date: 04/06/2004    Years since quitting: 15.6  . Smokeless tobacco: Former Systems developer    Types: Snuff, Sarina Ser    Quit date: 2018  . Tobacco comment: 1 can day for 15 years  Vaping Use  . Vaping Use: Never used  Substance Use Topics  . Alcohol use: Yes    Alcohol/week: 10.0 standard drinks    Types: 10 Standard drinks or equivalent per week     Comment: Social  . Drug use: No    Review of Systems Per HPI unless specifically indicated above     Objective:    BP 136/86   Pulse 63   Temp (!) 97.1 F (36.2 C) (Temporal)   Resp 16   Ht 6\' 7"  (2.007 m)   Wt (!) 397 lb (180.1 kg)   SpO2 98%   BMI 44.72 kg/m   Wt Readings from Last 3 Encounters:  11/13/19 (!) 397 lb (180.1 kg)  10/24/19 (!) 399 lb (181 kg)  01/03/19 (!) 395 lb (179.2 kg)    Physical Exam Vitals and nursing note reviewed.  Constitutional:      General: He is not in acute distress.    Appearance: He is well-developed. He is obese. He is not diaphoretic.     Comments: Well-appearing, comfortable, cooperative  HENT:     Head: Normocephalic and atraumatic.  Eyes:     General:        Right eye: No discharge.        Left eye: No discharge.     Conjunctiva/sclera: Conjunctivae normal.  Cardiovascular:     Rate and Rhythm: Normal rate.  Pulmonary:     Effort: Pulmonary effort is normal.  Skin:    General: Skin is warm and dry.     Findings: No erythema or rash.  Neurological:     Mental Status: He is alert and oriented to person, place, and time.  Psychiatric:        Behavior: Behavior normal.     Comments: Well groomed, good eye contact, normal speech and thoughts, mood is good, not anxious       Results for orders placed or performed in visit on 10/19/19  CBC with Differential/Platelet  Result Value Ref Range   WBC 5.8 3.8 - 10.8 Thousand/uL   RBC 4.98 4.20 - 5.80 Million/uL   Hemoglobin 15.0 13.2 - 17.1 g/dL   HCT 46.0 38 - 50 %   MCV 92.4 80.0 - 100.0 fL   MCH 30.1 27.0 - 33.0 pg   MCHC 32.6 32.0 - 36.0 g/dL   RDW 14.0 11.0 - 15.0 %   Platelets 182 140 - 400 Thousand/uL   MPV 9.7 7.5 - 12.5 fL   Neutro Abs 2,894 1,500 - 7,800 cells/uL   Lymphs Abs 2,175 850 - 3,900 cells/uL   Absolute Monocytes 441 200 - 950 cells/uL   Eosinophils Absolute 220 15 - 500 cells/uL   Basophils Absolute 70 0 - 200 cells/uL   Neutrophils Relative % 49.9  %   Total Lymphocyte 37.5 %   Monocytes Relative 7.6 %   Eosinophils Relative 3.8 %   Basophils Relative 1.2 %  Hemoglobin A1c  Result Value Ref Range   Hgb A1c MFr Bld 5.5 <5.7 % of total Hgb   Mean Plasma Glucose 111 (calc)   eAG (mmol/L) 6.2 (calc)  Lipid panel  Result Value Ref Range   Cholesterol 215 (H) <200 mg/dL   HDL 53 > OR = 40 mg/dL   Triglycerides 267 (H) <150 mg/dL   LDL Cholesterol (Calc) 121 (H) mg/dL (calc)   Total CHOL/HDL Ratio 4.1 <5.0 (calc)   Non-HDL Cholesterol (Calc) 162 (H) <130 mg/dL (calc)  COMPLETE METABOLIC PANEL WITH GFR  Result Value Ref Range   Glucose, Bld 95 65 - 99 mg/dL   BUN 15 7 - 25 mg/dL   Creat 1.23 0.60 - 1.35 mg/dL   GFR, Est Non African American 71 > OR = 60 mL/min/1.9m2   GFR, Est African American 83 > OR = 60 mL/min/1.68m2   BUN/Creatinine Ratio NOT APPLICABLE 6 - 22 (calc)   Sodium 137 135 - 146 mmol/L   Potassium 4.6 3.5 - 5.3 mmol/L   Chloride 103 98 - 110 mmol/L   CO2 26 20 - 32 mmol/L   Calcium 9.3 8.6 - 10.3 mg/dL   Total Protein 6.8 6.1 - 8.1 g/dL   Albumin 4.5 3.6 - 5.1 g/dL   Globulin 2.3 1.9 - 3.7 g/dL (calc)   AG Ratio 2.0 1.0 - 2.5 (calc)   Total Bilirubin 0.5 0.2 - 1.2 mg/dL   Alkaline phosphatase (APISO) 60 36 - 130 U/L   AST 24 10 - 40 U/L   ALT 30 9 - 46 U/L  TSH  Result Value Ref Range   TSH 4.01 0.40 - 4.50 mIU/L      Assessment & Plan:   Problem List Items Addressed This Visit    Uncomplicated alcohol dependence ([place])    See A&P Mood      Relevant Orders   Ambulatory referral to Psychiatry   Major depressive disorder, recurrent, moderate (HCC)    Active chronic depression recurrent moderate to severe Complex history of depression/mood and prior OSA affecting daytime fatigue Alcohol dependence ADHD Fam history brother - adult onset ADHD  Plan Continue Sertraline 100mg  daily - seems to help control irritability but does not resolve his mood problem Referral to Psychiatry ([place]) for further diagnostic testing, med management of both ADHD and Mood / Substance-Alcohol Counseling on limiting and caution with alcohol as form of self treatment, discussed future goals to taper/reduce  Follow-up as planned after seen Psych      Relevant Orders   Ambulatory referral to Psychiatry   Hx of gout   Relevant Medications   indomethacin (INDOCIN) 50 MG capsule   Essential hypertension, benign (Chronic)    Well-controlled HTN - Home BP readings improved   No known complications    Plan:  1. Continue current BP regimen - Lisinopril 10mg  daily 2. Encourage improved lifestyle - low sodium diet, regular exercise 3. Continue monitor BP outside office, bring readings to next visit, if persistently >140/90 or new symptoms notify office sooner      Attention deficit hyperactivity disorder (ADHD), combined type - Primary    Clinically consistent with ADHD based on current symptoms - >1 year, now affecting him more in job, affecting his work and other aspects Complex history of depression/mood and prior OSA affecting daytime fatigue Fam history brother - adult onset ADHD  Plan Referral to Psychiatry ([place]) for further diagnostic testing, med management of both  ADHD and Mood / Substance-Alcohol  Follow-up as planned after seen Psych      Relevant Orders   Ambulatory referral to Psychiatry    Other Visit Diagnoses    Acute gout due to other secondary cause involving toe of right foot       Relevant Medications   indomethacin (INDOCIN) 50 MG capsule      #Acute Gout Flare Recent onset, triggers walking, seafood, alcohol. Re order Indomethaicin, seems improved already on last dose. Uses PRN rarely, caution with reduced renal function on last lab. Future f/u if recurrence may recheck Uric Acid level and consider preventative therapy.  Orders Placed This Encounter  Procedures  . Ambulatory referral to Psychiatry     Referral Priority:   Routine    Referral Type:   Psychiatric    Referral Reason:   Specialty Services Required    Requested Specialty:   Psychiatry    Number of Visits Requested:   1     Meds ordered this encounter  Medications  . indomethacin (INDOCIN) 50 MG capsule    Sig: Take 1 capsule (50 mg total) by mouth 3 (three) times daily as needed. Take with food; may cause GI bleed    Dispense:  21 capsule    Refill:  2     Follow up plan: Return in about 3 months (around 02/13/2020) for 3 month Follow-up Psych/Mood PHQ, ADHD.   Nobie Putnam, [facility] 11/13/2019, 8:13 AM

## 2019-11-13 NOTE — Assessment & Plan Note (Signed)
Clinically consistent with ADHD based on current symptoms - >1 year, now affecting him more in job, affecting his work and other aspects Complex history of depression/mood and prior OSA affecting daytime fatigue Fam history brother - adult onset ADHD  Plan Referral to Psychiatry ([place]) for further diagnostic testing, med management of both ADHD and Mood / Substance-Alcohol  Follow-up as planned after seen Psych

## 2019-11-24 DIAGNOSIS — G4733 Obstructive sleep apnea (adult) (pediatric): Secondary | ICD-10-CM | POA: Diagnosis not present

## 2019-12-04 ENCOUNTER — Ambulatory Visit: Payer: PRIVATE HEALTH INSURANCE | Attending: Internal Medicine

## 2019-12-04 DIAGNOSIS — Z23 Encounter for immunization: Secondary | ICD-10-CM

## 2019-12-04 NOTE — Progress Notes (Signed)
   Covid-19 Vaccination Clinic  Name:  Darren Robinson    MRN: [id number] DOB: 23-Jan-1976  12/04/2019  Darren Robinson was observed post Covid-19 immunization for 15 minutes without incident. He was provided with Vaccine Information Sheet and instruction to access the V-Safe system.   Darren Robinson was instructed to call 911 with any severe reactions post vaccine: Marland Kitchen Difficulty breathing  . Swelling of face and throat  . A fast heartbeat  . A bad rash all over body  . Dizziness and weakness   Immunizations Administered    Name Date Dose VIS Date Route   Pfizer COVID-19 Vaccine 12/04/2019  3:02 PM 0.3 mL 05/31/2018 Intramuscular   Manufacturer: [place]   Lot: [id number]   [place]: [id number]

## 2019-12-25 ENCOUNTER — Ambulatory Visit: Payer: PRIVATE HEALTH INSURANCE | Attending: Internal Medicine

## 2019-12-25 DIAGNOSIS — Z23 Encounter for immunization: Secondary | ICD-10-CM

## 2019-12-25 NOTE — Progress Notes (Signed)
   Covid-19 Vaccination Clinic  Name:  ALONTE WULFF    MRN: [id number] DOB: November 26, 1975  12/25/2019  Mr. Piccione was observed post Covid-19 immunization for 15 minutes without incident. He was provided with Vaccine Information Sheet and instruction to access the V-Safe system.   Mr. Ruhland was instructed to call 911 with any severe reactions post vaccine: Marland Kitchen Difficulty breathing  . Swelling of face and throat  . A fast heartbeat  . A bad rash all over body  . Dizziness and weakness   Immunizations Administered    Name Date Dose VIS Date Route   Pfizer COVID-19 Vaccine 12/25/2019  2:53 PM 0.3 mL 05/31/2018 Intramuscular   Manufacturer: [place]   Lot: [id number]   [place]: [id number]

## 2020-01-25 ENCOUNTER — Other Ambulatory Visit: Payer: Self-pay | Admitting: Family Medicine

## 2020-01-25 DIAGNOSIS — F331 Major depressive disorder, recurrent, moderate: Secondary | ICD-10-CM

## 2020-01-25 NOTE — Telephone Encounter (Signed)
Requested medication (s) are due for refill today: yes  Requested medication (s) are on the active medication list: yes  Last refill:  11/02/18  #90 0 refills by historic provider  Future visit scheduled: yes  Notes to clinic: last ordered by patients previous provider     Requested Prescriptions  Pending Prescriptions Disp Refills   sertraline (ZOLOFT) 100 MG tablet [Pharmacy Med Name: SERTRALINE HCL 100 MG TABLET] 30 tablet 0    Sig: Take 1 tablet (100 mg total) by mouth daily.      Psychiatry:  Antidepressants - SSRI Passed - 01/25/2020 12:40 PM      Passed - Completed PHQ-2 or PHQ-9 in the last 360 days.      Passed - Valid encounter within last 6 months    Recent Outpatient Visits           2 months ago Attention deficit hyperactivity disorder (ADHD), combined type   [place], DO   3 months ago Annual physical exam   [place], DO   1 year ago Attention deficit hyperactivity disorder (ADHD), combined type   [place], DO   1 year ago Suspected Covid-19 Virus Infection   [place], NP   1 year ago Upper respiratory tract infection, unspecified type   [place], Bethel Born, NP       Future Appointments             In 2 weeks Parks Ranger, Devonne Doughty, DO [facility], [facility]

## 2020-01-31 DIAGNOSIS — F4320 Adjustment disorder, unspecified: Secondary | ICD-10-CM | POA: Diagnosis not present

## 2020-02-07 DIAGNOSIS — F4320 Adjustment disorder, unspecified: Secondary | ICD-10-CM | POA: Diagnosis not present

## 2020-02-14 ENCOUNTER — Ambulatory Visit: Payer: BC Managed Care – PPO | Admitting: Family Medicine

## 2020-02-14 DIAGNOSIS — F4320 Adjustment disorder, unspecified: Secondary | ICD-10-CM | POA: Diagnosis not present

## 2020-02-21 DIAGNOSIS — F4320 Adjustment disorder, unspecified: Secondary | ICD-10-CM | POA: Diagnosis not present

## 2020-02-27 DIAGNOSIS — E291 Testicular hypofunction: Secondary | ICD-10-CM | POA: Diagnosis not present

## 2020-02-27 DIAGNOSIS — N401 Enlarged prostate with lower urinary tract symptoms: Secondary | ICD-10-CM | POA: Diagnosis not present

## 2020-02-27 DIAGNOSIS — N5201 Erectile dysfunction due to arterial insufficiency: Secondary | ICD-10-CM | POA: Diagnosis not present

## 2020-02-27 DIAGNOSIS — Z79899 Other long term (current) drug therapy: Secondary | ICD-10-CM | POA: Diagnosis not present

## 2020-03-06 DIAGNOSIS — F4320 Adjustment disorder, unspecified: Secondary | ICD-10-CM | POA: Diagnosis not present

## 2020-03-13 DIAGNOSIS — F4320 Adjustment disorder, unspecified: Secondary | ICD-10-CM | POA: Diagnosis not present

## 2020-03-20 DIAGNOSIS — F4320 Adjustment disorder, unspecified: Secondary | ICD-10-CM | POA: Diagnosis not present

## 2020-04-03 DIAGNOSIS — F4320 Adjustment disorder, unspecified: Secondary | ICD-10-CM | POA: Diagnosis not present

## 2020-04-17 DIAGNOSIS — F4320 Adjustment disorder, unspecified: Secondary | ICD-10-CM | POA: Diagnosis not present

## 2020-05-01 DIAGNOSIS — F4320 Adjustment disorder, unspecified: Secondary | ICD-10-CM | POA: Diagnosis not present

## 2020-05-08 ENCOUNTER — Other Ambulatory Visit: Payer: Self-pay | Admitting: Family Medicine

## 2020-05-08 DIAGNOSIS — F331 Major depressive disorder, recurrent, moderate: Secondary | ICD-10-CM

## 2020-05-10 ENCOUNTER — Other Ambulatory Visit: Payer: Self-pay

## 2020-05-15 DIAGNOSIS — F4320 Adjustment disorder, unspecified: Secondary | ICD-10-CM | POA: Diagnosis not present

## 2020-05-22 DIAGNOSIS — G4733 Obstructive sleep apnea (adult) (pediatric): Secondary | ICD-10-CM | POA: Diagnosis not present

## 2020-05-29 DIAGNOSIS — F4320 Adjustment disorder, unspecified: Secondary | ICD-10-CM | POA: Diagnosis not present

## 2020-06-10 ENCOUNTER — Other Ambulatory Visit: Payer: Self-pay

## 2020-06-10 DIAGNOSIS — F331 Major depressive disorder, recurrent, moderate: Secondary | ICD-10-CM

## 2020-06-10 MED ORDER — SERTRALINE HCL 100 MG PO TABS: 100.0000 mg | ORAL_TABLET | Freq: Every day | ORAL | 3 refills | Status: DC

## 2020-06-10 NOTE — Progress Notes (Signed)
Refill sent to [facility] in [place]

## 2020-06-19 DIAGNOSIS — F4320 Adjustment disorder, unspecified: Secondary | ICD-10-CM | POA: Diagnosis not present

## 2020-06-25 DIAGNOSIS — F4320 Adjustment disorder, unspecified: Secondary | ICD-10-CM | POA: Diagnosis not present

## 2020-07-10 DIAGNOSIS — F4320 Adjustment disorder, unspecified: Secondary | ICD-10-CM | POA: Diagnosis not present

## 2020-07-31 DIAGNOSIS — F4320 Adjustment disorder, unspecified: Secondary | ICD-10-CM | POA: Diagnosis not present

## 2020-08-14 DIAGNOSIS — F4320 Adjustment disorder, unspecified: Secondary | ICD-10-CM | POA: Diagnosis not present

## 2020-09-04 DIAGNOSIS — F4320 Adjustment disorder, unspecified: Secondary | ICD-10-CM | POA: Diagnosis not present

## 2020-10-23 ENCOUNTER — Other Ambulatory Visit: Payer: Self-pay

## 2020-10-23 ENCOUNTER — Ambulatory Visit: Payer: Self-pay | Admitting: *Deleted

## 2020-10-23 ENCOUNTER — Encounter: Payer: Self-pay | Admitting: Emergency Medicine

## 2020-10-23 ENCOUNTER — Emergency Department
Admission: EM | Admit: 2020-10-23 | Discharge: 2020-10-23 | Disposition: A | Discharge status: Home / Self Care | Payer: BC Managed Care – PPO | Attending: Emergency Medicine | Admitting: Emergency Medicine

## 2020-10-23 DIAGNOSIS — Z87891 Personal history of nicotine dependence: Secondary | ICD-10-CM | POA: Diagnosis not present

## 2020-10-23 DIAGNOSIS — R059 Cough, unspecified: Secondary | ICD-10-CM | POA: Diagnosis not present

## 2020-10-23 DIAGNOSIS — I10 Essential (primary) hypertension: Secondary | ICD-10-CM | POA: Diagnosis not present

## 2020-10-23 DIAGNOSIS — R509 Fever, unspecified: Secondary | ICD-10-CM | POA: Diagnosis not present

## 2020-10-23 DIAGNOSIS — K529 Noninfective gastroenteritis and colitis, unspecified: Secondary | ICD-10-CM | POA: Diagnosis not present

## 2020-10-23 DIAGNOSIS — R0981 Nasal congestion: Secondary | ICD-10-CM | POA: Insufficient documentation

## 2020-10-23 DIAGNOSIS — Z79899 Other long term (current) drug therapy: Secondary | ICD-10-CM | POA: Diagnosis not present

## 2020-10-23 LAB — GASTROINTESTINAL PANEL BY PCR, STOOL (REPLACES STOOL CULTURE)

## 2020-10-23 LAB — CBC
HCT: 45.4 % (ref 39.0–52.0)
Hemoglobin: 15.9 g/dL (ref 13.0–17.0)
MCH: 31.1 pg (ref 26.0–34.0)
MCHC: 35 g/dL (ref 30.0–36.0)
MCV: 88.7 fL (ref 80.0–100.0)
Platelets: 138 10*3/uL — ABNORMAL LOW (ref 150–400)
RBC: 5.12 MIL/uL (ref 4.22–5.81)
RDW: 13 % (ref 11.5–15.5)
WBC: 7.5 10*3/uL (ref 4.0–10.5)
nRBC: 0 % (ref 0.0–0.2)

## 2020-10-23 LAB — COMPREHENSIVE METABOLIC PANEL
ALT: 71 U/L — ABNORMAL HIGH (ref 0–44)
AST: 39 U/L (ref 15–41)
Albumin: 4.4 g/dL (ref 3.5–5.0)
Alkaline Phosphatase: 32 U/L — ABNORMAL LOW (ref 38–126)
Anion gap: 11 (ref 5–15)
BUN: 12 mg/dL (ref 6–20)
CO2: 22 mmol/L (ref 22–32)
Calcium: 9.5 mg/dL (ref 8.9–10.3)
Chloride: 102 mmol/L (ref 98–111)
Creatinine, Ser: 1.28 mg/dL — ABNORMAL HIGH (ref 0.61–1.24)
GFR, Estimated: >60 mL/min (ref >60)
Glucose, Bld: 104 mg/dL — ABNORMAL HIGH (ref 70–99)
Potassium: 3.9 mmol/L (ref 3.5–5.1)
Sodium: 135 mmol/L (ref 135–145)
Total Bilirubin: 0.9 mg/dL (ref 0.3–1.2)
Total Protein: 7.5 g/dL (ref 6.5–8.1)

## 2020-10-23 LAB — TYPE AND SCREEN
ABO/RH(D): O POS
Antibody Screen: NEGATIVE

## 2020-10-23 MED ORDER — CIPROFLOXACIN HCL 500 MG PO TABS: 500.0000 mg | ORAL_TABLET | Freq: Two times a day (BID) | ORAL | 0 refills | Status: DC

## 2020-10-23 NOTE — Telephone Encounter (Signed)
Pt called in c/o having bright red, bloody diarrhea every 30 minutes that started last night.   He is also vomiting and can not keep anything thing down.   He is on day 7 of Covid.   See triage notes.  I have referred him to the ED.   He is going to [facility] now.  I sent my notes to [facility] for Dr. Parks Ranger.

## 2020-10-23 NOTE — Telephone Encounter (Signed)
Reason for Disposition  Patient sounds very sick or weak to the triager    On day 7 of having Covid.   Having bright red bloody diarrhea every 30 minutes and vomiting can't keep any food or water down.  Answer Assessment - Initial Assessment Questions 1. APPEARANCE of BLOOD: "What color is it?" "Is it passed separately, on the surface of the stool, or mixed in with the stool?"      Had Covid 7 days.  Now having blood in my stools since last night.   I'm having sharp pain in lower abd area and I'm passing bright red blood.   Every 30 minutes.   I'm vomiting and can't keep anything down.   2. AMOUNT: "How much blood was passed?"      Every 30 minutes I'm having diarrhea and passing bright red blood. 3. FREQUENCY: "How many times has blood been passed with the stools?"      Every 30 minutes 4. ONSET: "When was the blood first seen in the stools?" (Days or weeks)      Started last night 5. DIARRHEA: "Is there also some diarrhea?" If Yes, ask: "How many diarrhea stools in the past 24 hours?"      Yes bloody diarrhea 6. CONSTIPATION: "Do you have constipation?" If Yes, ask: "How bad is it?"     Not asked 7. RECURRENT SYMPTOMS: "Have you had blood in your stools before?" If Yes, ask: "When was the last time?" and "What happened that time?"      No   At this point I referred him on to the ED.    8. BLOOD THINNERS: "Do you take any blood thinners?" (e.g., Coumadin/warfarin, Pradaxa/dabigatran, aspirin)     Not asked 9. OTHER SYMPTOMS: "Do you have any other symptoms?"  (e.g., abdomen pain, vomiting, dizziness, fever)     Denies being dizzy or weak but vomiting and can't keep anything down as well as having the bloody diarrhea. 10. PREGNANCY: "Is there any chance you are pregnant?" "When was your last menstrual period?"       N/A  Protocols used: Rectal Bleeding-A-AH

## 2020-10-23 NOTE — ED Triage Notes (Addendum)
C/O lower abdominal pain intermittently and diarrhea.  States symptoms started today and patient having bloody diarrhea.  Patient states he took 800 mg Ibuprofen today 5-6 hours ago, pain improved after taking.  AAOx3.  Skin warm and dry. NAD

## 2020-10-23 NOTE — ED Provider Notes (Signed)
[facility] Emergency Department Provider Note   ____________________________________________    I have reviewed the triage vital signs and the nursing notes.   HISTORY  Chief Complaint GI Bleeding     HPI Darren Robinson is a 45 y.o. male presents with complaints of GI bleeding.  Patient reports 6 days ago he started having COVID symptoms.  He reports it has gone through his household and his children and his wife have all tested positive.  They ran out of home tests but he is assume that he has had a because he had fever congestion and mild cough with body aches and fatigue.  He has been vaccinated.  Has continued to have mild symptoms the last 1 to 2 days however developed loose stools which he describes as having bright red blood in them.  He is not on blood thinners.  He does describe some abdominal cramping in his lower abdomen.  No history of diverticulitis  Past Medical History:  Diagnosis Date   Essential hypertension, benign 02/11/2015   Fatigue    Gout    Hx of gout 08/05/2015   Hypertension    Obesity 02/11/2015   OSA (obstructive sleep apnea)    Sleep apnea    Vitamin D deficiency disease     Patient Active Problem List   Diagnosis Date Noted   Uncomplicated alcohol dependence ([place]) 10/24/2019   Major depressive disorder, recurrent, moderate ([place]) 10/24/2019   OSA on CPAP 01/03/2019   Attention deficit hyperactivity disorder (ADHD), combined type 01/03/2019   Morbid obesity with BMI of 40.0-44.9, adult ([place]) 12/27/2017   Gout of right foot 08/10/2016   Alcohol use 08/10/2016   Medication monitoring encounter 08/10/2016   Dyslipidemia 08/05/2015   Elevated serum creatinine 08/05/2015   Hx of gout 08/05/2015   Essential hypertension, benign 02/11/2015   Preventative health care 02/07/2015    Past Surgical History:  Procedure Laterality Date   APPENDECTOMY     NASAL SEPTOPLASTY W/ TURBINOPLASTY Bilateral 03/23/2017    Procedure: NASAL SEPTOPLASTY WITH TURBINATE REDUCTION;  Surgeon: Beverly Gust, MD;  Location: [facility] ORS;  Service: ENT;  Laterality: Bilateral;    Prior to Admission medications   Medication Sig Start Date End Date Taking? Authorizing Provider  ciprofloxacin (CIPRO) 500 MG tablet Take 1 tablet (500 mg total) by mouth 2 (two) times daily. 10/23/20  Yes Lavonia Drafts, MD  acetaminophen (TYLENOL) 500 MG tablet Take 1,000-1,500 mg by mouth every 6 (six) hours as needed for moderate pain or headache.    [provider]  clomiPHENE (CLOMID) 50 MG tablet Take 50 mg by mouth daily.     [provider]  indomethacin (INDOCIN) 50 MG capsule Take 1 capsule (50 mg total) by mouth 3 (three) times daily as needed. Take with food; may cause GI bleed 11/13/19   Parks Ranger, Devonne Doughty, DO  lisinopril (ZESTRIL) 10 MG tablet Take 1 tablet (10 mg total) by mouth daily. 10/24/19   Karamalegos, Devonne Doughty, DO  Multiple Vitamins-Minerals (CENTRUM ULTRA MENS PO) Take by mouth.    [provider]  sertraline (ZOLOFT) 100 MG tablet Take 1 tablet (100 mg total) by mouth daily. 06/10/20   Olin Hauser, DO     Allergies Androgel [testosterone]  Family History  Problem Relation Age of Onset   Alcohol abuse Mother    Arthritis Mother    Heart disease Mother    Mental illness Mother    Thyroid disease Mother  Diabetes Father    Heart attack Father    Kidney disease Father        On Dialysis   Heart disease Father    Hyperlipidemia Father    Thyroid disease Sister    Hyperlipidemia Sister    Hypertension Sister    Lung disease Maternal Grandfather    Heart attack Paternal Grandmother    Cancer Neg Hx    Stroke Neg Hx     Social History Social History   Tobacco Use   Smoking status: Former    Packs/day: 1.00    Years: 10.00    Pack years: 10.00    Types: Cigarettes    Quit date: 04/06/2004    Years since quitting: 16.5   Smokeless tobacco: Former    Types:  Snuff, Chew    Quit date: 2018   Tobacco comments:    1 can day for 15 years  Vaping Use   Vaping Use: Never used  Substance Use Topics   Alcohol use: Yes    Alcohol/week: 10.0 standard drinks    Types: 10 Standard drinks or equivalent per week    Comment: Social   Drug use: No    Review of Systems  Constitutional: No fever/chills Eyes: No visual changes.  ENT: No sore throat. Cardiovascular: Denies chest pain. Respiratory: Denies shortness of breath. Gastrointestinal: As above Genitourinary: Negative for dysuria. Musculoskeletal: Negative for back pain. Skin: Negative for rash. Neurological: Negative for headaches or weakness   ____________________________________________   PHYSICAL EXAM:  VITAL SIGNS: ED Triage Vitals  Enc Vitals Group     BP 10/23/20 1726 (!) 147/112     Pulse Rate 10/23/20 1726 84     Resp 10/23/20 1726 18     Temp 10/23/20 1726 98 F (36.7 C)     Temp Source 10/23/20 1726 Oral     SpO2 10/23/20 1726 98 %     Weight 10/23/20 1653 (!) 181.4 kg (400 lb)     Height 10/23/20 1653 2.007 m (6\' 7" )     Head Circumference --      Peak Flow --      Pain Score 10/23/20 1653 0     Pain Loc --      Pain Edu? --      Excl. in [place]? --     Constitutional: Alert and oriented.  Eyes: Conjunctivae are normal.   Nose: Mild congestion Mouth/Throat: Mucous membranes are moist.   Neck:  Painless ROM Cardiovascular: Normal rate, regular rhythm.  Respiratory: Normal respiratory effort.  No retractions. Lungs CTAB. Gastrointestinal: Soft and nontender. No distention.  No CVA  Musculoskeletal: No lower extremity tenderness nor edema.  Warm and well perfused Neurologic:  Normal speech and language. No gross focal neurologic deficits are appreciated.  Skin:  Skin is warm, dry and intact. No rash noted. Psychiatric: Mood and affect are normal. Speech and behavior are normal.  ____________________________________________   LABS (all labs ordered are listed,  but only abnormal results are displayed)  Labs Reviewed  COMPREHENSIVE METABOLIC PANEL - Abnormal; Notable for the following components:      Result Value   Glucose, Bld 104 (*)    Creatinine, Ser 1.28 (*)    ALT 71 (*)    Alkaline Phosphatase 32 (*)    All other components within normal limits  CBC - Abnormal; Notable for the following components:   Platelets 138 (*)    All other components within normal limits  GASTROINTESTINAL PANEL BY PCR,  STOOL (REPLACES STOOL CULTURE)  POC OCCULT BLOOD, ED  TYPE AND SCREEN   ____________________________________________  EKG None ____________________________________________  RADIOLOGY  None ____________________________________________   PROCEDURES  Procedure(s) performed: No  Procedures   Critical Care performed: No ____________________________________________   INITIAL IMPRESSION / ASSESSMENT AND PLAN / ED COURSE  Pertinent labs & imaging results that were available during my care of the patient were reviewed by me and considered in my medical decision making (see chart for details).   Patient presents with rectal bleeding as above, given abdominal cramping, viral illness and blood in stool suspicious for viral colitis.  Differential also includes bacterial colitis such as E. coli/Salmonella.  Lab work however is quite reassuring, hemoglobin is 15.9, higher than his baseline of 15.  He is not dizzy or tachycardic and overall feels well.  Discussed admission however the patient would greatly prefer to go home.  Given this we will obtain stool bio fire  Patient knows to return if any worsening of his symptoms   ____________________________________________   FINAL CLINICAL IMPRESSION(S) / ED DIAGNOSES  Final diagnoses:  Colitis        Note:  This document was prepared using Dragon voice recognition software and may include unintentional dictation errors.    Lavonia Drafts, MD 10/23/20 [phone]

## 2020-10-31 DIAGNOSIS — G4733 Obstructive sleep apnea (adult) (pediatric): Secondary | ICD-10-CM | POA: Diagnosis not present

## 2020-11-15 ENCOUNTER — Encounter: Payer: Self-pay | Admitting: Family Medicine

## 2020-11-15 ENCOUNTER — Other Ambulatory Visit: Payer: Self-pay

## 2020-11-15 ENCOUNTER — Ambulatory Visit (INDEPENDENT_AMBULATORY_CARE_PROVIDER_SITE_OTHER): Payer: BC Managed Care – PPO | Admitting: Family Medicine

## 2020-11-15 VITALS — BP 154/95 | HR 67 | Ht 79.0 in | Wt >= 6400 oz

## 2020-11-15 DIAGNOSIS — G4733 Obstructive sleep apnea (adult) (pediatric): Secondary | ICD-10-CM

## 2020-11-15 DIAGNOSIS — U099 Post covid-19 condition, unspecified: Secondary | ICD-10-CM | POA: Diagnosis not present

## 2020-11-15 DIAGNOSIS — A029 Salmonella infection, unspecified: Secondary | ICD-10-CM

## 2020-11-15 DIAGNOSIS — Z9989 Dependence on other enabling machines and devices: Secondary | ICD-10-CM

## 2020-11-15 DIAGNOSIS — I10 Essential (primary) hypertension: Secondary | ICD-10-CM

## 2020-11-15 NOTE — Progress Notes (Signed)
Subjective:    Patient ID: Darren Robinson, male    DOB: Jun 24, 1975, 45 y.o.   MRN: [id number]  Darren Robinson is a 45 y.o. male presenting on 11/15/2020 for Hospitalization Follow-up   HPI  ED FOLLOW-UP VISIT  Hospital/Location: [facility] Date of ED Visit: 10/23/20  Reason for Presenting to ED: GI Bleed / [place]  - ED provider note and record have been reviewed - Patient presents today about 3 weeks after recent ED visit. Brief summary of recent course, patient had symptoms of COVID19 illness with family infected and he was positive. A few days into symptoms he had upset stomach with diarrhea with some bright red blood in stool. ED visit and tested positive for Salmonella.  - Today reports overall has done well after discharge from ED. Symptoms of salmonella has resolved, completed Cipro 500 BID x 7 days. He still feels a little hesitant about dietary.  Worse tension headaches, always had GI issues. Possible IBS  Anxiety PTSD Going to therapy counseling, less  Nightmares less now  HTN Elevated BP lately.   I have reviewed the discharge medication list, and have reconciled the current and discharge medications today.   Current Outpatient Medications:    acetaminophen (TYLENOL) 500 MG tablet, Take 1,000-1,500 mg by mouth every 6 (six) hours as needed for moderate pain or headache., Disp: , Rfl:    clomiPHENE (CLOMID) 50 MG tablet, Take 50 mg by mouth daily. , Disp: , Rfl:    indomethacin (INDOCIN) 50 MG capsule, Take 1 capsule (50 mg total) by mouth 3 (three) times daily as needed. Take with food; may cause GI bleed, Disp: 21 capsule, Rfl: 2   Multiple Vitamins-Minerals (CENTRUM ULTRA MENS PO), Take by mouth., Disp: , Rfl:    sertraline (ZOLOFT) 100 MG tablet, Take 1 tablet (100 mg total) by mouth daily., Disp: 30 tablet, Rfl: 3   lisinopril (ZESTRIL) 10 MG tablet, Take 2 tablets (20 mg total) by mouth daily., Disp: 180 tablet, Rfl:  0  ------------------------------------------------------------------------- Social History   Tobacco Use   Smoking status: Former    Packs/day: 1.00    Years: 10.00    Pack years: 10.00    Types: Cigarettes    Quit date: 04/06/2004    Years since quitting: 16.6   Smokeless tobacco: Former    Types: Snuff, Chew    Quit date: 2018   Tobacco comments:    1 can day for 15 years  Vaping Use   Vaping Use: Never used  Substance Use Topics   Alcohol use: Yes    Alcohol/week: 10.0 standard drinks    Types: 10 Standard drinks or equivalent per week    Comment: Social   Drug use: No    Review of Systems Per HPI unless specifically indicated above     Objective:    BP (!) 154/95 (BP Location: Left Arm, Patient Position: Sitting, Cuff Size: Large)   Pulse 67   Ht '6\' 7"'$  (2.007 m)   Wt (!) 420 lb 8 oz (190.7 kg)   SpO2 95%   BMI 47.37 kg/m   Wt Readings from Last 3 Encounters:  11/15/20 (!) 420 lb 8 oz (190.7 kg)  10/23/20 (!) 400 lb (181.4 kg)  11/13/19 (!) 397 lb (180.1 kg)    Physical Exam Vitals and nursing note reviewed.  Constitutional:      General: He is not in acute distress.    Appearance: He is well-developed. He is not diaphoretic.  Comments: Well-appearing, comfortable, cooperative  HENT:     Head: Normocephalic and atraumatic.  Eyes:     General:        Right eye: No discharge.        Left eye: No discharge.     Conjunctiva/sclera: Conjunctivae normal.  Neck:     Thyroid: No thyromegaly.  Cardiovascular:     Rate and Rhythm: Normal rate and regular rhythm.     Pulses: Normal pulses.     Heart sounds: Normal heart sounds. No murmur heard. Pulmonary:     Effort: Pulmonary effort is normal. No respiratory distress.     Breath sounds: Normal breath sounds. No wheezing or rales.  Musculoskeletal:        General: Normal range of motion.     Cervical back: Normal range of motion and neck supple.  Lymphadenopathy:     Cervical: No cervical adenopathy.   Skin:    General: Skin is warm and dry.     Findings: No erythema or rash.  Neurological:     Mental Status: He is alert and oriented to person, place, and time. Mental status is at baseline.  Psychiatric:        Behavior: Behavior normal.     Comments: Well groomed, good eye contact, normal speech and thoughts       Results for orders placed or performed during the hospital encounter of 10/23/20  Gastrointestinal Panel by PCR , Stool   Specimen: Stool  Result Value Ref Range   Campylobacter species NOT DETECTED NOT DETECTED   Plesimonas shigelloides NOT DETECTED NOT DETECTED   Salmonella species DETECTED (A) NOT DETECTED   Yersinia enterocolitica NOT DETECTED NOT DETECTED   Vibrio species NOT DETECTED NOT DETECTED   Vibrio cholerae NOT DETECTED NOT DETECTED   Enteroaggregative E coli (EAEC) NOT DETECTED NOT DETECTED   Enteropathogenic E coli (EPEC) NOT DETECTED NOT DETECTED   Enterotoxigenic E coli (ETEC) NOT DETECTED NOT DETECTED   Shiga like toxin producing E coli (STEC) NOT DETECTED NOT DETECTED   Shigella/Enteroinvasive E coli (EIEC) NOT DETECTED NOT DETECTED   Cryptosporidium NOT DETECTED NOT DETECTED   Cyclospora cayetanensis NOT DETECTED NOT DETECTED   Entamoeba histolytica NOT DETECTED NOT DETECTED   Giardia lamblia NOT DETECTED NOT DETECTED   Adenovirus F40/41 NOT DETECTED NOT DETECTED   Astrovirus NOT DETECTED NOT DETECTED   Norovirus GI/GII NOT DETECTED NOT DETECTED   Rotavirus A NOT DETECTED NOT DETECTED   Sapovirus (I, II, IV, and V) NOT DETECTED NOT DETECTED  Comprehensive metabolic panel  Result Value Ref Range   Sodium 135 135 - 145 mmol/L   Potassium 3.9 3.5 - 5.1 mmol/L   Chloride 102 98 - 111 mmol/L   CO2 22 22 - 32 mmol/L   Glucose, Bld 104 (H) 70 - 99 mg/dL   BUN 12 6 - 20 mg/dL   Creatinine, Ser 1.28 (H) 0.61 - 1.24 mg/dL   Calcium 9.5 8.9 - 10.3 mg/dL   Total Protein 7.5 6.5 - 8.1 g/dL   Albumin 4.4 3.5 - 5.0 g/dL   AST 39 15 - 41 U/L   ALT  71 (H) 0 - 44 U/L   Alkaline Phosphatase 32 (L) 38 - 126 U/L   Total Bilirubin 0.9 0.3 - 1.2 mg/dL   GFR, Estimated >60 >60 mL/min   Anion gap 11 5 - 15  CBC  Result Value Ref Range   WBC 7.5 4.0 - 10.5 K/uL   RBC 5.12 4.22 -  5.81 MIL/uL   Hemoglobin 15.9 13.0 - 17.0 g/dL   HCT 45.4 39.0 - 52.0 %   MCV 88.7 80.0 - 100.0 fL   MCH 31.1 26.0 - 34.0 pg   MCHC 35.0 30.0 - 36.0 g/dL   RDW 13.0 11.5 - 15.5 %   Platelets 138 (L) 150 - 400 K/uL   nRBC 0.0 0.0 - 0.2 %  Type and screen [place]  Result Value Ref Range   ABO/RH(D) O POS    Antibody Screen NEG    Sample Expiration      10/26/2020,2359 Performed at [facility], [address] [place], [place] [address]       Assessment & Plan:   Problem List Items Addressed This Visit     Essential hypertension, benign - Primary (Chronic)   Relevant Medications   lisinopril (ZESTRIL) 10 MG tablet   OSA on CPAP   Other Visit Diagnoses     Salmonella food poisoning       Post-COVID syndrome          Salmonella food poisoning Resolved Completed Cipro course from ED  Post COVID Improved, has some residual fatigue likely source  HTN Uncontrolled On Lisinopril '10mg'$  daily has had elevated range improved on recheck today Double dose now Lisinopril '10mg'$  x 2 = '20mg'$  daily for now check BP report back in 1-2 weeks, can adjust dose further to '40mg'$  if need or just order '20mg'$  tab  OSA on CPAP Continues to benefit from therapy   No orders of the defined types were placed in this encounter.   Follow up plan: Return if symptoms worsen or fail to improve, for keep next apt.  [facility] orders in for 1 week before.  Nobie Putnam, [facility] 11/15/2020, 9:56 AM

## 2020-11-15 NOTE — Patient Instructions (Addendum)
Thank you for coming to the office today.  Double dose of Lisinopril from 10 to '20mg'$ . Let me know if this is working within 1-2 weeks, keep track of BP, goal < 140/90.  If not quite strong enough we can order the '40mg'$  dose.  Try probiotic to see if it helps.  OTC Peppermint Oil (Triple Coated Capsule) '180mg'$  take one 3 times daily to reduce diarrhea  We will plan on GI referral in Jan or Feb 2023 - for colonoscopy and possible IBS evaluation.  If symptoms do not improve, persistent headache, or dark stools or abdominal concerns let us know sooner.  Please schedule a Follow-up Appointment to: Return if symptoms worsen or fail to improve, for keep next apt.  If you have any other questions or concerns, please feel free to call the office or send a message through [place]. You may also schedule an earlier appointment if necessary.  Additionally, you may be receiving a survey about your experience at our office within a few days to 1 week by e-mail or mail. We value your feedback.  Nobie Putnam, DO [facility]

## 2020-12-18 ENCOUNTER — Other Ambulatory Visit: Payer: Self-pay | Admitting: Family Medicine

## 2020-12-18 DIAGNOSIS — F331 Major depressive disorder, recurrent, moderate: Secondary | ICD-10-CM

## 2020-12-18 DIAGNOSIS — I10 Essential (primary) hypertension: Secondary | ICD-10-CM

## 2020-12-18 MED ORDER — LISINOPRIL 20 MG PO TABS: 20.0000 mg | ORAL_TABLET | Freq: Every day | ORAL | 3 refills | Status: DC

## 2020-12-18 NOTE — Telephone Encounter (Signed)
Requested medication (s) are due for refill today: Yes  Requested medication (s) are on the active medication list: Yes  Last refill:  Unknown  Future visit scheduled: Yes  Notes to clinic:  Noted last OV note to increase dose to 20 mg daily, will need new Rx sent reflecting that change.     Requested Prescriptions  Pending Prescriptions Disp Refills   lisinopril (ZESTRIL) 10 MG tablet [Pharmacy Med Name: LISINOPRIL 10 MG TABLET] 90 tablet 0    Sig: Take 1 tablet (10 mg total) by mouth daily.     Cardiovascular:  ACE Inhibitors Failed - 12/18/2020 11:59 AM      Failed - Cr in normal range and within 180 days    Creat  Date Value Ref Range Status  10/19/2019 1.23 0.60 - 1.35 mg/dL Final   Creatinine, Ser  Date Value Ref Range Status  10/23/2020 1.28 (H) 0.61 - 1.24 mg/dL Final          Failed - Last BP in normal range    BP Readings from Last 1 Encounters:  11/15/20 (!) 154/95          Passed - K in normal range and within 180 days    Potassium  Date Value Ref Range Status  10/23/2020 3.9 3.5 - 5.1 mmol/L Final          Passed - Patient is not pregnant      Passed - Valid encounter within last 6 months    Recent Outpatient Visits           1 month ago Essential hypertension, benign   [facility] Olin Hauser, DO   1 year ago Attention deficit hyperactivity disorder (ADHD), combined type   [place], DO   1 year ago Annual physical exam   [facility] Olin Hauser, DO   1 year ago Attention deficit hyperactivity disorder (ADHD), combined type   [facility] Olin Hauser, DO   2 years ago Suspected Covid-19 Virus Infection   [place], NP       Future Appointments             In 1 month Parks Ranger, Devonne Doughty, DO [facility], [facility]            Signed Prescriptions Disp  Refills   sertraline (ZOLOFT) 100 MG tablet 90 tablet 1    Sig: Take 1 tablet (100 mg total) by mouth daily.     Psychiatry:  Antidepressants - SSRI Passed - 12/18/2020 11:59 AM      Passed - Completed PHQ-2 or PHQ-9 in the last 360 days      Passed - Valid encounter within last 6 months    Recent Outpatient Visits           1 month ago Essential hypertension, benign   [place], DO   1 year ago Attention deficit hyperactivity disorder (ADHD), combined type   [place], DO   1 year ago Annual physical exam   [facility] Olin Hauser, DO   1 year ago Attention deficit hyperactivity disorder (ADHD), combined type   [place], DO   2 years ago Suspected Covid-19 Virus Infection   [place], NP  Future Appointments             In 1 month Karamalegos, Devonne Doughty, DO [facility], [facility]

## 2021-01-23 ENCOUNTER — Other Ambulatory Visit: Payer: Self-pay | Admitting: Family Medicine

## 2021-01-23 DIAGNOSIS — M10471 Other secondary gout, right ankle and foot: Secondary | ICD-10-CM

## 2021-01-23 DIAGNOSIS — Z8739 Personal history of other diseases of the musculoskeletal system and connective tissue: Secondary | ICD-10-CM

## 2021-01-24 ENCOUNTER — Encounter: Payer: Self-pay | Admitting: Family Medicine

## 2021-01-24 DIAGNOSIS — M10471 Other secondary gout, right ankle and foot: Secondary | ICD-10-CM

## 2021-01-24 MED ORDER — COLCHICINE 0.6 MG PO TABS
0.6000 mg | ORAL_TABLET | Freq: Every day | ORAL | 2 refills | Status: DC | PRN
Start: 2021-01-24 — End: 2022-01-09

## 2021-01-24 NOTE — Telephone Encounter (Signed)
Requested Prescriptions  Pending Prescriptions Disp Refills  . indomethacin (INDOCIN) 50 MG capsule [Pharmacy Med Name: INDOMETHACIN 50 MG CAPSULE] 21 capsule 0    Sig: Take 1 capsule (50 mg total) by mouth 3 (three) times daily as needed. Take with food; may cause GI bleed     Analgesics:  NSAIDS Failed - 01/23/2021  5:55 PM      Failed - Cr in normal range and within 360 days    Creat  Date Value Ref Range Status  10/19/2019 1.23 0.60 - 1.35 mg/dL Final   Creatinine, Ser  Date Value Ref Range Status  10/23/2020 1.28 (H) 0.61 - 1.24 mg/dL Final         Passed - HGB in normal range and within 360 days    Hemoglobin  Date Value Ref Range Status  10/23/2020 15.9 13.0 - 17.0 g/dL Final  02/07/2015 15.5 12.6 - 17.7 g/dL Final         Passed - Patient is not pregnant      Passed - Valid encounter within last 12 months    Recent Outpatient Visits          2 months ago Essential hypertension, benign   [facility] Olin Hauser, DO   1 year ago Attention deficit hyperactivity disorder (ADHD), combined type   [place], DO   1 year ago Annual physical exam   [place], DO   2 years ago Attention deficit hyperactivity disorder (ADHD), combined type   [place], DO   2 years ago Suspected Covid-19 Virus Infection   [place], NP      Future Appointments            In 1 week Parks Ranger, Devonne Doughty, DO [facility], [facility]

## 2021-02-03 ENCOUNTER — Encounter: Payer: Self-pay | Admitting: Family Medicine

## 2021-02-03 ENCOUNTER — Other Ambulatory Visit: Payer: Self-pay

## 2021-02-03 ENCOUNTER — Other Ambulatory Visit: Payer: Self-pay | Admitting: Family Medicine

## 2021-02-03 ENCOUNTER — Ambulatory Visit (INDEPENDENT_AMBULATORY_CARE_PROVIDER_SITE_OTHER): Payer: BC Managed Care – PPO | Admitting: Family Medicine

## 2021-02-03 VITALS — BP 165/71 | HR 76 | Ht 79.0 in | Wt >= 6400 oz

## 2021-02-03 DIAGNOSIS — Z8739 Personal history of other diseases of the musculoskeletal system and connective tissue: Secondary | ICD-10-CM

## 2021-02-03 DIAGNOSIS — Z6841 Body Mass Index (BMI) 40.0 and over, adult: Secondary | ICD-10-CM

## 2021-02-03 DIAGNOSIS — F1020 Alcohol dependence, uncomplicated: Secondary | ICD-10-CM

## 2021-02-03 DIAGNOSIS — R7309 Other abnormal glucose: Secondary | ICD-10-CM

## 2021-02-03 DIAGNOSIS — E785 Hyperlipidemia, unspecified: Secondary | ICD-10-CM

## 2021-02-03 DIAGNOSIS — I10 Essential (primary) hypertension: Secondary | ICD-10-CM | POA: Diagnosis not present

## 2021-02-03 DIAGNOSIS — Z0000 Encounter for general adult medical examination without abnormal findings: Secondary | ICD-10-CM | POA: Diagnosis not present

## 2021-02-03 DIAGNOSIS — L821 Other seborrheic keratosis: Secondary | ICD-10-CM

## 2021-02-03 DIAGNOSIS — Z125 Encounter for screening for malignant neoplasm of prostate: Secondary | ICD-10-CM

## 2021-02-03 DIAGNOSIS — F331 Major depressive disorder, recurrent, moderate: Secondary | ICD-10-CM

## 2021-02-03 DIAGNOSIS — D492 Neoplasm of unspecified behavior of bone, soft tissue, and skin: Secondary | ICD-10-CM

## 2021-02-03 DIAGNOSIS — F17220 Nicotine dependence, chewing tobacco, uncomplicated: Secondary | ICD-10-CM

## 2021-02-03 MED ORDER — VARENICLINE TARTRATE 0.5 MG X 11 & 1 MG X 42 PO TBPK: ORAL_TABLET | ORAL | 0 refills | Status: DC

## 2021-02-03 NOTE — Patient Instructions (Addendum)
Thank you for coming to the office today.  [place]   [place], [place] [address] Hours: 8AM-5PM Phone: [phone]  Sarina Ser, MD  We will plan on GI referral in Jan or Feb 2023 - for colonoscopy and possible IBS evaluation.   Varenicline - Chantix Dosing: Duration Dosage  Initial 3 days  0.48m daily  Days 4-7 0.543mtwice daily  Day 8 through the end of therapy  25m225mwice daily   Prescribing Instructions: Use of "Chantix - Starting Month Pak" and "Chantix - Continuing Month Pak" provide easy to use blister packaging.  Black Box Warning: Mood Change -"Serious neuropsychiatric events have been reported." Stop Drug and contact health care provider if patient. "agitation, hostility, depressed mood or changes in thinking or behavior that are not typical for the patient are observed, or if the patient develops suicidal ideation / behavior." Document potential mood change.  Patients can continue to smoke while initiating varenicline. A "target quit date" should be set on calendar approximately Day 8 to max Day 30 (of the treatment) - PREFERRED QUIT DATE - 1 to 2 weeks from start of medicine. Advise to take this medication WITH food.  Minimizing the nausea (typically mild and transient).  If nausea occurs, consider dose reduction or temporary cessation of treatment.   The treatment should be continued for 12 weeks.  An additional 12 weeks of therapy can be used at the prescribers discretion.   Chantix can change the way people react to alcohol (decreased tolerance, increased drunkenness, unusual or aggressive behavior, memory loss) Seizures occurred in patients that had no history of seizures and in patients that had a seizure disorder that had been well controlled  Discuss Cardiovascular Safety   Colon Cancer Screening: - For all adults age 73+45+utine colon cancer screening is highly recommended.     - Recent guidelines from  [place] starting age of 45 70Early detection of colon cancer is important, because often there are no warning signs or symptoms, also if found early usually it can be cured. Late stage is hard to treat.  - If you are not interested in Colonoscopy screening (if done and normal you could be cleared for 5 to 10 years until next due), then Cologuard is an excellent alternative for screening test for Colon Cancer. It is highly sensitive for detecting DNA of colon cancer from even the earliest stages. Also, there is NO bowel prep required. - If Cologuard is NEGATIVE, then it is good for 3 years before next due - If Cologuard is POSITIVE, then it is strongly advised to get a Colonoscopy, which allows the GI doctor to locate the source of the cancer or polyp (even very early stage) and treat it by removing it. ------------------------- If you would like to proceed with Cologuard (stool DNA test) - FIRST, call your insurance company and tell them you want to check cost of Cologuard tell them CPT Code [phone] may be completely covered and you could get for no cost, OR max cost without any coverage is about $600). Also, keep in mind if you do NOT open the kit, and decide not to do the test, you will NOT be charged, you should contact the company if you decide not to do the test. - If you want to proceed, you can notify us [place] message, MyCMidlandr at next visit) and we will order it for you. The test kit  will be delivered to you house within about 1 week. Follow instructions to collect sample, you may call the company for any help or questions, 24/7 telephone support at [phone].   Please schedule a Follow-up Appointment to: Return in about 3 months (around 05/06/2021) for 3 month follow-up IBS/GI referral, updates on nicotine / alcohol.  If you have any other questions or concerns, please feel free to call the office or send a message through [place]. You may also  schedule an earlier appointment if necessary.  Additionally, you may be receiving a survey about your experience at our office within a few days to 1 week by e-mail or mail. We value your feedback.  Nobie Putnam, DO [facility]

## 2021-02-03 NOTE — Progress Notes (Signed)
Subjective:    Patient ID: Darren Robinson, male    DOB: 1975-10-29, 45 y.o.   MRN: [id number]  Darren Robinson is a 45 y.o. male presenting on 02/03/2021 for Annual Exam   HPI  Here for Annual Physical and due for [facility] labs.  Hx Gout Chronic recurrent, no flares recently He did have R thumb hand flare of pain and improved on Colchicine and medication. Due for uric acid  Alcohol Dependence, without complication No bourbon alcohol in >6 months. Now drinking a few beers 5-6 per day, now down to 4-5 times per week, not every day, he has talked with counselor recently that has helped him cut back on alcohol He does describe mood at times can be worse with agitation and irritability, he does take Zoloft 100mg  daily, he is worse off the medication for few days. He says alcohol helps him sleep at times in past, he is no longer using alcohol to help sleep He says attributes a lot of his stressors to past history of working in Event organiser, and football.   Health Maintenance: Declines vaccines  Future colon cancer screening age 81+, cologuard handout given. Considering Colonoscopy  PSA lab to be ordered.  Depression screen [facility] 2/9 02/03/2021 11/15/2020 11/13/2019  Decreased Interest 3 1 2   Down, Depressed, Hopeless 2 0 1  PHQ - 2 Score 5 1 3   Altered sleeping 3 3 2   Tired, decreased energy 3 1 3   Change in appetite 3 3 3   Feeling bad or failure about yourself  2 0 2  Trouble concentrating 3 3 3   Moving slowly or fidgety/restless 1 0 1  Suicidal thoughts 0 0 0  PHQ-9 Score 20 11 17   Difficult doing work/chores Very difficult Not difficult at all Somewhat difficult  Some recent data might be hidden    Past Medical History:  Diagnosis Date   Essential hypertension, benign 02/11/2015   Fatigue    Gout    Hx of gout 08/05/2015   Hypertension    Obesity 02/11/2015   OSA (obstructive sleep apnea)    Sleep apnea    Vitamin D deficiency disease    Past Surgical History:   Procedure Laterality Date   APPENDECTOMY     NASAL SEPTOPLASTY W/ TURBINOPLASTY Bilateral 03/23/2017   Procedure: NASAL SEPTOPLASTY WITH TURBINATE REDUCTION;  Surgeon: Beverly Gust, MD;  Location: [facility] ORS;  Service: ENT;  Laterality: Bilateral;   Social History   Socioeconomic History   Marital status: Married    Spouse name: Brandi   Number of children: 2   Years of education: 18   Highest education level: Master's degree (e.g., MA, MS, MEng, MEd, MSW, MBA)  Occupational History   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: [place]   Occupation: retired    Comment: Engineer, structural  Tobacco Use   Smoking status: Former    Packs/day: 1.00    Years: 10.00    Pack years: 10.00    Types: Cigarettes    Quit date: 04/06/2004    Years since quitting: 16.8   Smokeless tobacco: Former    Types: Snuff, Chew    Quit date: 2018   Tobacco comments:    1 can day for 15 years  Vaping Use   Vaping Use: Never used  Substance and Sexual Activity   Alcohol use: Yes    Alcohol/week: 10.0 standard drinks    Types: 10 Standard drinks or equivalent per week    Comment: Social  Drug use: No   Sexual activity: Yes    Partners: Female  Other Topics Concern   Not on file  Social History Narrative   Not on file   Social Determinants of Health   Financial Resource Strain: Not on file  Food Insecurity: Not on file  Transportation Needs: Not on file  Physical Activity: Not on file  Stress: Not on file  Social Connections: Not on file  Intimate Partner Violence: Not on file   Family History  Problem Relation Age of Onset   Alcohol abuse Mother    Arthritis Mother    Heart disease Mother    Mental illness Mother    Thyroid disease Mother    Diabetes Father    Heart attack Father    Kidney disease Father        On Dialysis   Heart disease Father    Hyperlipidemia Father    Thyroid disease Sister    Hyperlipidemia Sister    Hypertension Sister    Lung disease Maternal Grandfather     Heart attack Paternal Grandmother    Cancer Neg Hx    Stroke Neg Hx     Current Outpatient Medications on File Prior to Visit  Medication Sig   acetaminophen (TYLENOL) 500 MG tablet Take 1,000-1,500 mg by mouth every 6 (six) hours as needed for moderate pain or headache.   clomiPHENE (CLOMID) 50 MG tablet Take 50 mg by mouth daily.    colchicine 0.6 MG tablet Take 1 tablet (0.6 mg total) by mouth daily as needed (gout). May repeat 2nd dose on Day 1. Take for 7-10 days or until resolved.   indomethacin (INDOCIN) 50 MG capsule Take 1 capsule (50 mg total) by mouth 3 (three) times daily as needed. Take with food; may cause GI bleed   lisinopril (ZESTRIL) 20 MG tablet Take 1 tablet (20 mg total) by mouth daily.   Multiple Vitamins-Minerals (CENTRUM ULTRA MENS PO) Take by mouth.   sertraline (ZOLOFT) 100 MG tablet Take 1 tablet (100 mg total) by mouth daily.   No current facility-administered medications on file prior to visit.    Review of Systems  Constitutional:  Negative for activity change, appetite change, chills, diaphoresis, fatigue and fever.  HENT:  Negative for congestion and hearing loss.   Eyes:  Negative for visual disturbance.  Respiratory:  Negative for cough, chest tightness, shortness of breath and wheezing.   Cardiovascular:  Negative for chest pain, palpitations and leg swelling.  Gastrointestinal:  Negative for abdominal pain, constipation, diarrhea, nausea and vomiting.  Genitourinary:  Negative for dysuria, frequency and hematuria.  Musculoskeletal:  Negative for arthralgias and neck pain.  Skin:  Negative for rash.  Neurological:  Negative for dizziness, weakness, light-headedness, numbness and headaches.  Hematological:  Negative for adenopathy.  Psychiatric/Behavioral:  Negative for behavioral problems, dysphoric mood and sleep disturbance.   Per HPI unless specifically indicated above      Objective:    BP (!) 165/71   Pulse 76   Ht 6\' 7"  (2.007 m)    Wt (!) 425 lb 8 oz (193 kg)   SpO2 96%   BMI 47.93 kg/m   Wt Readings from Last 3 Encounters:  02/03/21 (!) 425 lb 8 oz (193 kg)  11/15/20 (!) 420 lb 8 oz (190.7 kg)  10/23/20 (!) 400 lb (181.4 kg)    Physical Exam Vitals and nursing note reviewed.  Constitutional:      General: He is not in acute distress.  Appearance: He is well-developed. He is not diaphoretic.     Comments: Well-appearing, comfortable, cooperative  HENT:     Head: Normocephalic and atraumatic.  Eyes:     General:        Right eye: No discharge.        Left eye: No discharge.     Conjunctiva/sclera: Conjunctivae normal.     Pupils: Pupils are equal, round, and reactive to light.  Neck:     Thyroid: No thyromegaly.  Cardiovascular:     Rate and Rhythm: Normal rate and regular rhythm.     Pulses: Normal pulses.     Heart sounds: Normal heart sounds. No murmur heard. Pulmonary:     Effort: Pulmonary effort is normal. No respiratory distress.     Breath sounds: Normal breath sounds. No wheezing or rales.  Abdominal:     General: Bowel sounds are normal. There is no distension.     Palpations: Abdomen is soft. There is no mass.     Tenderness: There is no abdominal tenderness.  Musculoskeletal:        General: No tenderness. Normal range of motion.     Cervical back: Normal range of motion and neck supple.     Comments: Upper / Lower Extremities: - Normal muscle tone, strength bilateral upper extremities 5/5, lower extremities 5/5  Lymphadenopathy:     Cervical: No cervical adenopathy.  Skin:    General: Skin is warm and dry.     Findings: No erythema or rash.  Neurological:     Mental Status: He is alert and oriented to person, place, and time.     Comments: Distal sensation intact to light touch all extremities  Psychiatric:        Mood and Affect: Mood normal.        Behavior: Behavior normal.        Thought Content: Thought content normal.     Comments: Well groomed, good eye contact, normal  speech and thoughts       Results for orders placed or performed during the hospital encounter of 10/23/20  Gastrointestinal Panel by PCR , Stool   Specimen: Stool  Result Value Ref Range   Campylobacter species NOT DETECTED NOT DETECTED   Plesimonas shigelloides NOT DETECTED NOT DETECTED   Salmonella species DETECTED (A) NOT DETECTED   Yersinia enterocolitica NOT DETECTED NOT DETECTED   Vibrio species NOT DETECTED NOT DETECTED   Vibrio cholerae NOT DETECTED NOT DETECTED   Enteroaggregative E coli (EAEC) NOT DETECTED NOT DETECTED   Enteropathogenic E coli (EPEC) NOT DETECTED NOT DETECTED   Enterotoxigenic E coli (ETEC) NOT DETECTED NOT DETECTED   Shiga like toxin producing E coli (STEC) NOT DETECTED NOT DETECTED   Shigella/Enteroinvasive E coli (EIEC) NOT DETECTED NOT DETECTED   Cryptosporidium NOT DETECTED NOT DETECTED   Cyclospora cayetanensis NOT DETECTED NOT DETECTED   Entamoeba histolytica NOT DETECTED NOT DETECTED   Giardia lamblia NOT DETECTED NOT DETECTED   Adenovirus F40/41 NOT DETECTED NOT DETECTED   Astrovirus NOT DETECTED NOT DETECTED   Norovirus GI/GII NOT DETECTED NOT DETECTED   Rotavirus A NOT DETECTED NOT DETECTED   Sapovirus (I, II, IV, and V) NOT DETECTED NOT DETECTED  Comprehensive metabolic panel  Result Value Ref Range   Sodium 135 135 - 145 mmol/L   Potassium 3.9 3.5 - 5.1 mmol/L   Chloride 102 98 - 111 mmol/L   CO2 22 22 - 32 mmol/L   Glucose, Bld 104 (H) 70 -  99 mg/dL   BUN 12 6 - 20 mg/dL   Creatinine, Ser 1.28 (H) 0.61 - 1.24 mg/dL   Calcium 9.5 8.9 - 10.3 mg/dL   Total Protein 7.5 6.5 - 8.1 g/dL   Albumin 4.4 3.5 - 5.0 g/dL   AST 39 15 - 41 U/L   ALT 71 (H) 0 - 44 U/L   Alkaline Phosphatase 32 (L) 38 - 126 U/L   Total Bilirubin 0.9 0.3 - 1.2 mg/dL   GFR, Estimated >60 >60 mL/min   Anion gap 11 5 - 15  CBC  Result Value Ref Range   WBC 7.5 4.0 - 10.5 K/uL   RBC 5.12 4.22 - 5.81 MIL/uL   Hemoglobin 15.9 13.0 - 17.0 g/dL   HCT 45.4 39.0 -  52.0 %   MCV 88.7 80.0 - 100.0 fL   MCH 31.1 26.0 - 34.0 pg   MCHC 35.0 30.0 - 36.0 g/dL   RDW 13.0 11.5 - 15.5 %   Platelets 138 (L) 150 - 400 K/uL   nRBC 0.0 0.0 - 0.2 %  Type and screen [facility]  Result Value Ref Range   ABO/RH(D) O POS    Antibody Screen NEG    Sample Expiration      10/26/2020,2359 Performed at [facility], [address] [place], [place] [address]       Assessment & Plan:   Problem List Items Addressed This Visit     Essential hypertension, benign (Chronic)   Relevant Orders   Comprehensive metabolic panel   Uncomplicated alcohol dependence ([place])   Morbid obesity with BMI of 40.0-44.9, adult ([place])   Major depressive disorder, recurrent, moderate (HCC)   Hx of gout   Relevant Orders   Uric acid   Dyslipidemia   Relevant Orders   Lipid panel   Other Visit Diagnoses     Annual physical exam    -  Primary   Relevant Orders   Lipid panel   CBC with Differential/Platelet   Hemoglobin A1c   PSA   Comprehensive metabolic panel   Abnormal blood sugar       Relevant Orders   Hemoglobin A1c   Screening for prostate cancer       Relevant Orders   PSA   Abnormal skin growth       Relevant Orders   Ambulatory referral to Dermatology   Seborrheic keratoses       Relevant Orders   Ambulatory referral to Dermatology       Updated Health Maintenance information [facility] orders given today, later this week fasting lab. Encouraged improvement to lifestyle with diet and exercise Goal of weight loss  Will check uric acid, given recent gout flare  Alcohol dependence, uncomplicated Significant improvement in reduced amount of alcohol Now consuming beer instead of liquor, may have triggered gout flare Continues to reduce amount, working with therapist, changing behavior Goal to come off alcohol in future, will consider med options when ready  Nicotine Dependence, on smokeless tobacco Trial back on Chantix has  been successful.  Referral to Dermatology for skin surveillance and eval 2 abnormal growths on scalp, appear to be similar to non pigmented SKs, without high risk features but notable increased growth  Orders Placed This Encounter  Procedures   Lipid panel    Order Specific Question:   Has the patient fasted?    Answer:   Yes   CBC with Differential/Platelet   Hemoglobin A1c   PSA  Comprehensive metabolic panel    Order Specific Question:   Has the patient fasted?    Answer:   Yes   Uric acid   Ambulatory referral to Dermatology    Referral Priority:   Routine    Referral Type:   Consultation    Referral Reason:   Specialty Services Required    Requested Specialty:   Dermatology    Number of Visits Requested:   1     No orders of the defined types were placed in this encounter.     Follow up plan: Return in about 3 months (around 05/06/2021) for 3 month follow-up IBS/GI referral, updates on nicotine / alcohol.  Nobie Putnam, DO [facility] 02/03/2021, 1:50 PM

## 2021-02-21 DIAGNOSIS — G4733 Obstructive sleep apnea (adult) (pediatric): Secondary | ICD-10-CM | POA: Diagnosis not present

## 2021-03-10 ENCOUNTER — Other Ambulatory Visit: Payer: Self-pay

## 2021-03-10 DIAGNOSIS — I10 Essential (primary) hypertension: Secondary | ICD-10-CM

## 2021-03-10 MED ORDER — LISINOPRIL 20 MG PO TABS: 20.0000 mg | ORAL_TABLET | Freq: Every day | ORAL | 3 refills | Status: DC

## 2021-05-05 ENCOUNTER — Telehealth: Payer: Self-pay

## 2021-05-05 NOTE — Telephone Encounter (Signed)
Copied from [place] [phone]. Topic: General - Inquiry >> May 05, 2021  3:21 PM Greggory Keen D wrote: Reason for CRM: pt wants to stop by this afternoon and get a lab sheet for the lab work Dr. Raliegh Ip has in the system.  He works for [employer] and he will get them drawn himself.

## 2021-05-06 ENCOUNTER — Ambulatory Visit: Payer: BC Managed Care – PPO | Admitting: Family Medicine

## 2021-05-07 ENCOUNTER — Ambulatory Visit: Payer: BC Managed Care – PPO | Admitting: Family Medicine

## 2021-08-06 ENCOUNTER — Ambulatory Visit: Payer: PRIVATE HEALTH INSURANCE | Admitting: Dermatology

## 2021-08-23 ENCOUNTER — Other Ambulatory Visit: Payer: Self-pay | Admitting: Family Medicine

## 2021-08-23 DIAGNOSIS — F331 Major depressive disorder, recurrent, moderate: Secondary | ICD-10-CM

## 2021-08-25 NOTE — Telephone Encounter (Signed)
Requested medication (s) are due for refill today: yes  Requested medication (s) are on the active medication list: yes  Last refill:  12/18/20  Future visit scheduled: no  Notes to clinic:  Unable to refill per protocol, appointment needed.      Requested Prescriptions  Pending Prescriptions Disp Refills   sertraline (ZOLOFT) 100 MG tablet [Pharmacy Med Name: SERTRALINE HCL 100 MG TABLET] 30 tablet 0    Sig: Take 1 tablet (100 mg total) by mouth daily.     Psychiatry:  Antidepressants - SSRI - sertraline Failed - 08/23/2021 11:04 AM      Failed - ALT in normal range and within 360 days    ALT  Date Value Ref Range Status  10/23/2020 71 (H) 0 - 44 U/L Final         Failed - Valid encounter within last 6 months    Recent Outpatient Visits           6 months ago Annual physical exam   [facility] Olin Hauser, DO   9 months ago Essential hypertension, benign   [place], Devonne Doughty, DO   1 year ago Attention deficit hyperactivity disorder (ADHD), combined type   [place], DO   1 year ago Annual physical exam   [facility] Olin Hauser, DO   2 years ago Attention deficit hyperactivity disorder (ADHD), combined type   [place], DO       Future Appointments             In 2 months Ralene Bathe, MD [place] - AST in normal range and within 360 days    AST  Date Value Ref Range Status  10/23/2020 39 15 - 41 U/L Final         Passed - Completed PHQ-2 or PHQ-9 in the last 360 days

## 2021-11-12 ENCOUNTER — Encounter: Payer: Self-pay | Admitting: Dermatology

## 2021-11-12 ENCOUNTER — Ambulatory Visit: Payer: BC Managed Care – PPO | Admitting: Dermatology

## 2021-11-12 DIAGNOSIS — L578 Other skin changes due to chronic exposure to nonionizing radiation: Secondary | ICD-10-CM | POA: Diagnosis not present

## 2021-11-12 DIAGNOSIS — L814 Other melanin hyperpigmentation: Secondary | ICD-10-CM

## 2021-11-12 DIAGNOSIS — D492 Neoplasm of unspecified behavior of bone, soft tissue, and skin: Secondary | ICD-10-CM

## 2021-11-12 DIAGNOSIS — D224 Melanocytic nevi of scalp and neck: Secondary | ICD-10-CM | POA: Diagnosis not present

## 2021-11-12 NOTE — Progress Notes (Signed)
   New Patient Visit  Subjective  Darren Robinson is a 46 y.o. male who presents for the following: lesions (Scalp. C/O raised areas. Dur: 20 years.   One on back of scalp has been there all patient's life. Getting larger ). The patient has spots, moles and lesions to be evaluated, some may be new or changing and the patient has concerns that these could be cancer.  Review of Systems: No other skin or systemic complaints except as noted in HPI or Assessment and Plan.  Objective  Well appearing patient in no apparent distress; mood and affect are within normal limits.  A focused examination was performed including head, including the scalp, face, neck, nose, ears, eyelids, and lips. Relevant physical exam findings are noted in the Assessment and Plan.  Left Crown Scalp 1.2 cm flesh colored plaque     Right Mid Lateral Vertex Scalp 1.2 cm flesh colored plaque      Assessment & Plan   Actinic Damage - chronic, secondary to cumulative UV radiation exposure/sun exposure over time - diffuse scaly erythematous macules with underlying dyspigmentation - Recommend daily broad spectrum sunscreen SPF 30+ to sun-exposed areas, reapply every 2 hours as needed.  - Recommend staying in the shade or wearing long sleeves, sun glasses (UVA+UVB protection) and wide brim hats (4-inch brim around the entire circumference of the hat). - Call for new or changing lesions.  Lentigines - Scattered tan macules - Due to sun exposure - Benign-appering, observe - Recommend daily broad spectrum sunscreen SPF 30+ to sun-exposed areas, reapply every 2 hours as needed. - Call for any changes  Neoplasm of skin (2) Left Crown Scalp Epidermal / dermal shaving  Lesion diameter (cm):  1.2 Informed consent: discussed and consent obtained   Timeout: patient name, date of birth, surgical site, and procedure verified   Procedure prep:  Patient was prepped and draped in usual sterile fashion Prep type:   Isopropyl alcohol Anesthesia: the lesion was anesthetized in a standard fashion   Anesthetic:  1% lidocaine w/ epinephrine 1-100,000 buffered w/ 8.4% NaHCO3 Instrument used: flexible razor blade   Hemostasis achieved with: pressure, aluminum chloride and electrodesiccation   Outcome: patient tolerated procedure well   Post-procedure details: sterile dressing applied and wound care instructions given   Dressing type: bandage and petrolatum    Specimen 1 - Surgical pathology Differential Diagnosis: Irritated nevus R/O Dysplasia Check Margins: No  Right Mid Lateral Vertex Scalp Epidermal / dermal shaving  Lesion diameter (cm):  1.2 Informed consent: discussed and consent obtained   Timeout: patient name, date of birth, surgical site, and procedure verified   Procedure prep:  Patient was prepped and draped in usual sterile fashion Prep type:  Isopropyl alcohol Anesthesia: the lesion was anesthetized in a standard fashion   Anesthetic:  1% lidocaine w/ epinephrine 1-100,000 buffered w/ 8.4% NaHCO3 Instrument used: flexible razor blade   Hemostasis achieved with: pressure, aluminum chloride and electrodesiccation   Outcome: patient tolerated procedure well   Post-procedure details: sterile dressing applied and wound care instructions given   Dressing type: bandage and petrolatum    Specimen 2 - Surgical pathology Differential Diagnosis: Irritated nevus R/O Dysplasia Check Margins: No  Return if symptoms worsen or fail to improve.  I, [name], CMA, am acting as scribe for Sarina Ser, MD. Documentation: I have reviewed the above documentation for accuracy and completeness, and I agree with the above.  Sarina Ser, MD

## 2021-11-12 NOTE — Patient Instructions (Signed)
Wound Care Instructions  Cleanse wound gently with soap and water once a day then pat dry with clean gauze. Apply a thin coat of Petrolatum (petroleum jelly, "Vaseline") over the wound (unless you have an allergy to this). We recommend that you use a new, sterile tube of Vaseline. Do not pick or remove scabs. Do not remove the yellow or white "healing tissue" from the base of the wound.  Cover the wound with fresh, clean, nonstick gauze and secure with paper tape. You may use Band-Aids in place of gauze and tape if the wound is small enough, but would recommend trimming much of the tape off as there is often too much. Sometimes Band-Aids can irritate the skin.  You should call the office for your biopsy report after 1 week if you have not already been contacted.  If you experience any problems, such as abnormal amounts of bleeding, swelling, significant bruising, significant pain, or evidence of infection, please call the office immediately.  FOR ADULT SURGERY PATIENTS: If you need something for pain relief you may take 1 extra strength Tylenol (acetaminophen) AND 2 Ibuprofen (200mg each) together every 4 hours as needed for pain. (do not take these if you are allergic to them or if you have a reason you should not take them.) Typically, you may only need pain medication for 1 to 3 days.     Due to recent changes in healthcare laws, you may see results of your pathology and/or laboratory studies on MyChart before the doctors have had a chance to review them. We understand that in some cases there may be results that are confusing or concerning to you. Please understand that not all results are received at the same time and often the doctors may need to interpret multiple results in order to provide you with the best plan of care or course of treatment. Therefore, we ask that you please give us 2 business days to thoroughly review all your results before contacting the office for clarification. Should  we see a critical lab result, you will be contacted sooner.   If You Need Anything After Your Visit  If you have any questions or concerns for your doctor, please call our main line at [phone] and press option 4 to reach your doctor's medical assistant. If no one answers, please leave a voicemail as directed and we will return your call as soon as possible. Messages left after 4 pm will be answered the following business day.   You may also send us a message via MyChart. We typically respond to MyChart messages within 1-2 business days.  For prescription refills, please ask your pharmacy to contact our office. Our fax number is [phone].  If you have an urgent issue when the clinic is closed that cannot wait until the next business day, you can page your doctor at the number below.    Please note that while we do our best to be available for urgent issues outside of office hours, we are not available 24/7.   If you have an urgent issue and are unable to reach us, you may choose to seek medical care at your doctor's office, retail clinic, urgent care center, or emergency room.  If you have a medical emergency, please immediately call 911 or go to the emergency department.  Pager Numbers  - Dr. Kowalski: [phone]  - Dr. Moye: [phone]  - Dr. Stewart: [phone]  In the event of inclement weather, please call our main line at   [phone] for an update on the status of any delays or closures.  Dermatology Medication Tips: Please keep the boxes that topical medications come in in order to help keep track of the instructions about where and how to use these. Pharmacies typically print the medication instructions only on the boxes and not directly on the medication tubes.   If your medication is too expensive, please contact our office at [phone] option 4 or send us a message through MyChart.   We are unable to tell what your co-pay for medications will be in  advance as this is different depending on your insurance coverage. However, we may be able to find a substitute medication at lower cost or fill out paperwork to get insurance to cover a needed medication.   If a prior authorization is required to get your medication covered by your insurance company, please allow us 1-2 business days to complete this process.  Drug prices often vary depending on where the prescription is filled and some pharmacies may offer cheaper prices.  The website www.goodrx.com contains coupons for medications through different pharmacies. The prices here do not account for what the cost may be with help from insurance (it may be cheaper with your insurance), but the website can give you the price if you did not use any insurance.  - You can print the associated coupon and take it with your prescription to the pharmacy.  - You may also stop by our office during regular business hours and pick up a GoodRx coupon card.  - If you need your prescription sent electronically to a different pharmacy, notify our office through [facility] MyChart or by phone at [phone] option 4.     Si Usted Necesita Algo Despus de Su Visita  Tambin puede enviarnos un mensaje a travs de MyChart. Por lo general respondemos a los mensajes de MyChart en el transcurso de 1 a 2 das hbiles.  Para renovar recetas, por favor pida a su farmacia que se ponga en contacto con nuestra oficina. Nuestro nmero de fax es el [phone].  Si tiene un asunto urgente cuando la clnica est cerrada y que no puede esperar hasta el siguiente da hbil, puede llamar/localizar a su doctor(a) al nmero que aparece a continuacin.   Por favor, tenga en cuenta que aunque hacemos todo lo posible para estar disponibles para asuntos urgentes fuera del horario de oficina, no estamos disponibles las 24 horas del da, los 7 das de la semana.   Si tiene un problema urgente y no puede comunicarse con nosotros, puede  optar por buscar atencin mdica  en el consultorio de su doctor(a), en una clnica privada, en un centro de atencin urgente o en una sala de emergencias.  Si tiene una emergencia mdica, por favor llame inmediatamente al 911 o vaya a la sala de emergencias.  Nmeros de bper  - Dr. Kowalski: [phone]  - Dra. Moye: [phone]  - Dra. Stewart: [phone]  En caso de inclemencias del tiempo, por favor llame a nuestra lnea principal al [phone] para una actualizacin sobre el estado de cualquier retraso o cierre.  Consejos para la medicacin en dermatologa: Por favor, guarde las cajas en las que vienen los medicamentos de uso tpico para ayudarle a seguir las instrucciones sobre dnde y cmo usarlos. Las farmacias generalmente imprimen las instrucciones del medicamento slo en las cajas y no directamente en los tubos del medicamento.   Si su medicamento es muy caro, por favor, pngase en contacto con   nuestra oficina llamando al [phone] y presione la opcin 4 o envenos un mensaje a travs de MyChart.   No podemos decirle cul ser su copago por los medicamentos por adelantado ya que esto es diferente dependiendo de la cobertura de su seguro. Sin embargo, es posible que podamos encontrar un medicamento sustituto a menor costo o llenar un formulario para que el seguro cubra el medicamento que se considera necesario.   Si se requiere una autorizacin previa para que su compaa de seguros cubra su medicamento, por favor permtanos de 1 a 2 das hbiles para completar este proceso.  Los precios de los medicamentos varan con frecuencia dependiendo del lugar de dnde se surte la receta y alguna farmacias pueden ofrecer precios ms baratos.  El sitio web www.goodrx.com tiene cupones para medicamentos de diferentes farmacias. Los precios aqu no tienen en cuenta lo que podra costar con la ayuda del seguro (puede ser ms barato con su seguro), pero el sitio web puede darle el  precio si no utiliz ningn seguro.  - Puede imprimir el cupn correspondiente y llevarlo con su receta a la farmacia.  - Tambin puede pasar por nuestra oficina durante el horario de atencin regular y recoger una tarjeta de cupones de GoodRx.  - Si necesita que su receta se enve electrnicamente a una farmacia diferente, informe a nuestra oficina a travs de MyChart de [place] o por telfono llamando al [phone] y presione la opcin 4.  

## 2021-11-17 ENCOUNTER — Telehealth: Payer: Self-pay

## 2021-11-17 NOTE — Telephone Encounter (Signed)
Called patient. Discussed pathology results. Patient voiced understanding. JP

## 2021-11-17 NOTE — Telephone Encounter (Signed)
-----   Message from Ralene Bathe, MD sent at 11/17/2021 11:14 AM EDT ----- Diagnosis 1. Skin , left crown scalp MELANOCYTIC NEVUS, INTRADERMAL TYPE, IRRITATED 2. Skin , right mid lateral vertex scalp MELANOCYTIC NEVUS, INTRADERMAL TYPE, INFLAMED  1&2 - both benign irritated/inflamed moles No further treatment needed

## 2021-11-18 ENCOUNTER — Encounter: Payer: Self-pay | Admitting: Dermatology

## 2022-01-09 ENCOUNTER — Ambulatory Visit (INDEPENDENT_AMBULATORY_CARE_PROVIDER_SITE_OTHER): Payer: Self-pay | Admitting: Family Medicine

## 2022-01-09 ENCOUNTER — Encounter: Payer: Self-pay | Admitting: Family Medicine

## 2022-01-09 VITALS — BP 144/95 | HR 73 | Ht 79.0 in | Wt >= 6400 oz

## 2022-01-09 DIAGNOSIS — M10471 Other secondary gout, right ankle and foot: Secondary | ICD-10-CM

## 2022-01-09 DIAGNOSIS — F331 Major depressive disorder, recurrent, moderate: Secondary | ICD-10-CM

## 2022-01-09 DIAGNOSIS — M10072 Idiopathic gout, left ankle and foot: Secondary | ICD-10-CM

## 2022-01-09 DIAGNOSIS — I10 Essential (primary) hypertension: Secondary | ICD-10-CM

## 2022-01-09 DIAGNOSIS — Z6841 Body Mass Index (BMI) 40.0 and over, adult: Secondary | ICD-10-CM

## 2022-01-09 DIAGNOSIS — E785 Hyperlipidemia, unspecified: Secondary | ICD-10-CM

## 2022-01-09 DIAGNOSIS — Z125 Encounter for screening for malignant neoplasm of prostate: Secondary | ICD-10-CM

## 2022-01-09 DIAGNOSIS — Z1211 Encounter for screening for malignant neoplasm of colon: Secondary | ICD-10-CM

## 2022-01-09 DIAGNOSIS — F1020 Alcohol dependence, uncomplicated: Secondary | ICD-10-CM

## 2022-01-09 DIAGNOSIS — R7309 Other abnormal glucose: Secondary | ICD-10-CM

## 2022-01-09 DIAGNOSIS — Z0000 Encounter for general adult medical examination without abnormal findings: Secondary | ICD-10-CM

## 2022-01-09 DIAGNOSIS — G4733 Obstructive sleep apnea (adult) (pediatric): Secondary | ICD-10-CM

## 2022-01-09 MED ORDER — COLCHICINE 0.6 MG PO TABS: 0.6000 mg | ORAL_TABLET | Freq: Every day | ORAL | 2 refills | Status: AC | PRN

## 2022-01-09 MED ORDER — NALTREXONE HCL 50 MG PO TABS: 50.0000 mg | ORAL_TABLET | Freq: Every day | ORAL | 2 refills | Status: DC

## 2022-01-09 MED ORDER — VALSARTAN 160 MG PO TABS: 160.0000 mg | ORAL_TABLET | Freq: Every day | ORAL | 3 refills | Status: DC

## 2022-01-09 NOTE — Patient Instructions (Addendum)
Thank you for coming to the office today.  Keep checking BP at home if able, goal < 140 / 90  Stop Lisinopril 53m Start Valsartan 1688mdaily  -------------------------------------------------  Start Naltrexone 502maily for alcohol, it will reduce the pleasure sensation and benefit from alcohol intake and help you cut back.  Use a schedule of lowering alcohol amounts over first 2+ weeks. Let me know if you are ready to increase from 50 to 100m73mnytime after week 1  [facility] new CPAP machine  [facility] [id number] [place] [phone]: [phone] sent via fax today. Stay tuned to hear back from them, they should call you to schedule initial sleep study, likely a home test and then if abnormal - they will arrange the 2nd part of the sleep study in the lab with the CPAP machine.  If there is any issue with this company, for example not covered by insurance or other problem, please notify us [place] they may do so a well - we will need to change the location of the referral.   ----------------------------------  Labs today including sugar, cholesterol, prostate cancer screening  Ordered the Cologuard (home kit) test for colon cancer screening. Stay tuned for further updates.  It will be shipped to you directly. If not received in 2-4 weeks, call us [place] company.   If you send it back and no results are received in 2-4 weeks, call us [place] company as well!   Colon Cancer Screening: - For all adults age 35+ 55+tine colon cancer screening is highly recommended.     - Recent guidelines from [place] starting age of 45 -73arly detection of colon cancer is important, because often there are no warning signs or symptoms, also if found early usually it can be cured. Late stage is hard to treat.   - If Cologuard is NEGATIVE, then it is good for 3 years before next due - If Cologuard is POSITIVE, then  it is strongly advised to get a Colonoscopy, which allows the GI doctor to locate the source of the cancer or polyp (even very early stage) and treat it by removing it. ------------------------- Follow instructions to collect sample, you may call the company for any help or questions, 24/7 telephone support at [phone]-----------------------------------------------------------    Please schedule a Follow-up Appointment to: Return in about 3 months (around 04/11/2022) for 3 month follow-up HTN, Alcohol, OSA.  If you have any other questions or concerns, please feel free to call the office or send a message through [place] may also schedule an earlier appointment if necessary.  Additionally, you may be receiving a survey about your experience at our office within a few days to 1 week by e-mail or mail. We value your feedback.  [facility]
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
## 2022-01-10 ENCOUNTER — Other Ambulatory Visit: Payer: Self-pay | Admitting: Family Medicine

## 2022-01-10 DIAGNOSIS — F331 Major depressive disorder, recurrent, moderate: Secondary | ICD-10-CM

## 2022-01-12 ENCOUNTER — Other Ambulatory Visit: Payer: Self-pay | Admitting: Family Medicine

## 2022-01-12 DIAGNOSIS — F331 Major depressive disorder, recurrent, moderate: Secondary | ICD-10-CM

## 2022-01-12 MED ORDER — SERTRALINE HCL 100 MG PO TABS: 100.0000 mg | ORAL_TABLET | Freq: Every day | ORAL | 3 refills | Status: DC

## 2022-01-12 NOTE — Telephone Encounter (Signed)
Requested medications are due for refill today.  yes  Requested medications are on the active medications list.  yes  Last refill. 08/25/2021 #30 0 rf  Future visit scheduled.   yes  Notes to clinic.  Labs are expired.    Requested Prescriptions  Pending Prescriptions Disp Refills   sertraline (ZOLOFT) 100 MG tablet [Pharmacy Med Name: SERTRALINE HCL 100 MG TABLET] 30 tablet 0    Sig: Take 1 tablet (100 mg total) by mouth daily.     Psychiatry:  Antidepressants - SSRI - sertraline Failed - 01/10/2022  1:15 PM      Failed - AST in normal range and within 360 days    AST  Date Value Ref Range Status  10/23/2020 39 15 - 41 U/L Final         Failed - ALT in normal range and within 360 days    ALT  Date Value Ref Range Status  10/23/2020 71 (H) 0 - 44 U/L Final         Passed - Completed PHQ-2 or PHQ-9 in the last 360 days      Passed - Valid encounter within last 6 months    Recent Outpatient Visits           3 days ago Annual physical exam   [place], DO   11 months ago Annual physical exam   [facility] Olin Hauser, DO   1 year ago Essential hypertension, benign   [place], DO   2 years ago Attention deficit hyperactivity disorder (ADHD), combined type   [place], DO   2 years ago Annual physical exam   [place], DO       Future Appointments             In 3 months [place], Devonne Doughty, [facility], [facility]

## 2022-01-12 NOTE — Telephone Encounter (Signed)
Requested medication (s) are due for refill today: yes  Requested medication (s) are on the active medication list: yes  Last refill:  08/25/21 #30  Future visit scheduled: yes  Notes to clinic:  overdue lab work    Requested Prescriptions  Pending Prescriptions Disp Refills   sertraline (ZOLOFT) 100 MG tablet [Pharmacy Med Name: SERTRALINE HCL 100 MG TABLET] 30 tablet 0    Sig: Take 1 tablet (100 mg total) by mouth daily.     Psychiatry:  Antidepressants - SSRI - sertraline Failed - 01/10/2022  1:13 PM      Failed - AST in normal range and within 360 days    AST  Date Value Ref Range Status  10/23/2020 39 15 - 41 U/L Final         Failed - ALT in normal range and within 360 days    ALT  Date Value Ref Range Status  10/23/2020 71 (H) 0 - 44 U/L Final         Passed - Completed PHQ-2 or PHQ-9 in the last 360 days      Passed - Valid encounter within last 6 months    Recent Outpatient Visits           3 days ago Annual physical exam   [place], DO   11 months ago Annual physical exam   [facility] Olin Hauser, DO   1 year ago Essential hypertension, benign   [place], DO   2 years ago Attention deficit hyperactivity disorder (ADHD), combined type   [place], DO   2 years ago Annual physical exam   [place], DO       Future Appointments             In 3 months [place] Ranger, Devonne Doughty, [facility], [facility]

## 2022-01-13 LAB — CBC WITH DIFFERENTIAL/PLATELET
Absolute Monocytes: 329 cells/uL (ref 200–950)
Basophils Absolute: 49 cells/uL (ref 0–200)
Basophils Relative: 0.9 %
Eosinophils Absolute: 248 cells/uL (ref 15–500)
Eosinophils Relative: 4.6 %
HCT: 49.5 % (ref 38.5–50.0)
Hemoglobin: 15.8 g/dL (ref 13.2–17.1)
Lymphs Abs: 2311 cells/uL (ref 850–3900)
MCH: 31.9 pg (ref 27.0–33.0)
MCHC: 31.9 g/dL — ABNORMAL LOW (ref 32.0–36.0)
MCV: 100 fL (ref 80.0–100.0)
MPV: 11 fL (ref 7.5–12.5)
Monocytes Relative: 6.1 %
Neutro Abs: 2462 cells/uL (ref 1500–7800)
Neutrophils Relative %: 45.6 %
Platelets: 210 10*3/uL (ref 140–400)
RBC: 4.95 10*6/uL (ref 4.20–5.80)
RDW: 14.3 % (ref 11.0–15.0)
Total Lymphocyte: 42.8 %
WBC: 5.4 10*3/uL (ref 3.8–10.8)

## 2022-01-13 LAB — COMPLETE METABOLIC PANEL WITH GFR
AG Ratio: 2.1 (calc) (ref 1.0–2.5)
ALT: 114 U/L — ABNORMAL HIGH (ref 9–46)
AST: 54 U/L — ABNORMAL HIGH (ref 10–40)
Albumin: 4.8 g/dL (ref 3.6–5.1)
Alkaline phosphatase (APISO): 87 U/L (ref 36–130)
BUN: 14 mg/dL (ref 7–25)
CO2: 26 mmol/L (ref 20–32)
Calcium: 9.5 mg/dL (ref 8.6–10.3)
Chloride: 104 mmol/L (ref 98–110)
Creat: 1.11 mg/dL (ref 0.60–1.29)
Globulin: 2.3 g/dL (calc) (ref 1.9–3.7)
Glucose, Bld: 157 mg/dL — ABNORMAL HIGH (ref 65–99)
Potassium: 4.5 mmol/L (ref 3.5–5.3)
Sodium: 138 mmol/L (ref 135–146)
Total Bilirubin: 0.4 mg/dL (ref 0.2–1.2)
Total Protein: 7.1 g/dL (ref 6.1–8.1)
eGFR: 83 mL/min/{1.73_m2} (ref >=60)

## 2022-01-13 LAB — HEMOGLOBIN A1C
Hgb A1c MFr Bld: 8.3 % of total Hgb — ABNORMAL HIGH (ref <5.7)
Mean Plasma Glucose: 192 mg/dL
eAG (mmol/L): 10.6 mmol/L

## 2022-01-13 LAB — LIPID PANEL
Cholesterol: 225 mg/dL — ABNORMAL HIGH (ref <200)
HDL: 72 mg/dL (ref >=40)
LDL Cholesterol (Calc): 130 mg/dL (calc) — ABNORMAL HIGH
Non-HDL Cholesterol (Calc): 153 mg/dL (calc) — ABNORMAL HIGH (ref <130)
Total CHOL/HDL Ratio: 3.1 (calc) (ref <5.0)
Triglycerides: 124 mg/dL (ref <150)

## 2022-01-13 LAB — TSH: TSH: 3.8 mIU/L (ref 0.40–4.50)

## 2022-01-13 LAB — PSA: PSA: 0.16 ng/mL (ref <=4.00)

## 2022-01-13 LAB — URIC ACID: Uric Acid, Serum: 5.7 mg/dL (ref 4.0–8.0)

## 2022-01-15 ENCOUNTER — Encounter: Payer: Self-pay | Admitting: Family Medicine

## 2022-01-15 DIAGNOSIS — R7309 Other abnormal glucose: Secondary | ICD-10-CM | POA: Insufficient documentation

## 2022-01-15 DIAGNOSIS — E1169 Type 2 diabetes mellitus with other specified complication: Secondary | ICD-10-CM | POA: Insufficient documentation

## 2022-02-02 LAB — COLOGUARD: COLOGUARD: NEGATIVE

## 2022-04-13 ENCOUNTER — Ambulatory Visit: Payer: BC Managed Care – PPO | Admitting: Family Medicine

## 2022-04-13 ENCOUNTER — Encounter: Payer: Self-pay | Admitting: Family Medicine

## 2022-04-13 VITALS — BP 138/80 | HR 81 | Ht 79.0 in | Wt >= 6400 oz

## 2022-04-13 DIAGNOSIS — E1169 Type 2 diabetes mellitus with other specified complication: Secondary | ICD-10-CM

## 2022-04-13 DIAGNOSIS — Z6841 Body Mass Index (BMI) 40.0 and over, adult: Secondary | ICD-10-CM | POA: Diagnosis not present

## 2022-04-13 DIAGNOSIS — R7309 Other abnormal glucose: Secondary | ICD-10-CM | POA: Diagnosis not present

## 2022-04-13 DIAGNOSIS — E291 Testicular hypofunction: Secondary | ICD-10-CM | POA: Insufficient documentation

## 2022-04-13 LAB — POCT GLYCOSYLATED HEMOGLOBIN (HGB A1C): Hemoglobin A1C: 8.4 % — AB (ref 4.0–5.6)

## 2022-04-13 MED ORDER — METFORMIN HCL ER 500 MG PO TB24: 500.0000 mg | ORAL_TABLET | Freq: Every day | ORAL | 0 refills | Status: DC

## 2022-04-13 NOTE — Progress Notes (Signed)
Subjective:    Patient ID: Darren Robinson, male    DOB: 06-16-1975, 47 y.o.   MRN: [id number]  Darren Robinson is a 47 y.o. male presenting on 04/13/2022 for Hypertension, Prediabetes, and ADHD   HPI  CHRONIC DM, Type 2, new diagnosis Trying to improve lifestyle but still issues with sugar and weight Meds: None prior to now, new start today Currently on ARB Lifestyle: Weight down 10 lbs in 3 months Improving exercise regimen Joined gym He tore biceps muscle at tendon elbow, before Christmas, he has had it repaired surgically already on rehab program now. Denies hypoglycemia, polyuria, visual changes, numbness or tingling.  Hypogonadism Low Testosterone Low Libido Past history treated for >10 years by prior Urology and Dr Yves Dill, has been without Urology care for a few years now plans to resume He was following Urology and was on Clomid, but then insurance stopped covering it. He will return to Androgel as next option      04/13/2022    8:52 AM 01/09/2022    8:37 AM 02/03/2021    1:37 PM  Depression screen PHQ 2/9  Decreased Interest '2 3 3  '$ Down, Depressed, Hopeless '1 2 2  '$ PHQ - 2 Score '3 5 5  '$ Altered sleeping '2 1 3  '$ Tired, decreased energy '3 1 3  '$ Change in appetite 0 2 3  Feeling bad or failure about yourself  0 1 2  Trouble concentrating '3 3 3  '$ Moving slowly or fidgety/restless 0 0 1  Suicidal thoughts 0 0 0  PHQ-9 Score '11 13 20  '$ Difficult doing work/chores Extremely dIfficult Very difficult Very difficult    Social History   Tobacco Use   Smoking status: Former    Packs/day: 1.00    Years: 10.00    Total pack years: 10.00    Types: Cigarettes    Quit date: 04/06/2004    Years since quitting: 18.0   Smokeless tobacco: Former    Types: Snuff, Chew    Quit date: 2018   Tobacco comments:    1 can day for 15 years  Vaping Use   Vaping Use: Never used  Substance Use Topics   Alcohol use: Yes    Alcohol/week: 10.0 standard drinks of alcohol    Types: 10  Standard drinks or equivalent per week    Comment: Social   Drug use: No    Review of Systems Per HPI unless specifically indicated above     Objective:    BP 138/80 (BP Location: Left Arm, Cuff Size: Large)   Pulse 81   Ht '6\' 7"'$  (2.007 m)   Wt (!) 430 lb (195 kg)   SpO2 99%   BMI 48.44 kg/m   Wt Readings from Last 3 Encounters:  04/13/22 (!) 430 lb (195 kg)  01/09/22 (!) 441 lb (200 kg)  02/03/21 (!) 425 lb 8 oz (193 kg)    Physical Exam Vitals and nursing note reviewed.  Constitutional:      General: He is not in acute distress.    Appearance: He is well-developed. He is obese. He is not diaphoretic.     Comments: Well-appearing, comfortable, cooperative  HENT:     Head: Normocephalic and atraumatic.  Eyes:     General:        Right eye: No discharge.        Left eye: No discharge.     Conjunctiva/sclera: Conjunctivae normal.  Neck:     Thyroid: No thyromegaly.  Cardiovascular:  Rate and Rhythm: Normal rate and regular rhythm.     Pulses: Normal pulses.     Heart sounds: Normal heart sounds. No murmur heard. Pulmonary:     Effort: Pulmonary effort is normal. No respiratory distress.     Breath sounds: Normal breath sounds. No wheezing or rales.  Musculoskeletal:        General: Normal range of motion.     Cervical back: Normal range of motion and neck supple.  Lymphadenopathy:     Cervical: No cervical adenopathy.  Skin:    General: Skin is warm and dry.     Findings: No erythema or rash.  Neurological:     Mental Status: He is alert and oriented to person, place, and time. Mental status is at baseline.  Psychiatric:        Behavior: Behavior normal.     Comments: Well groomed, good eye contact, normal speech and thoughts    Results for orders placed or performed in visit on 04/13/22  POCT HgB A1C  Result Value Ref Range   Hemoglobin A1C 8.4 (A) 4.0 - 5.6 %      Assessment & Plan:   Problem List Items Addressed This Visit     Hypogonadism in  male    Chronic problem >10 years Prior documentation at [facility] Dr Yves Dill - now retiring Goal to restart HRT referral to [facility] now for further management  Check testosterone lab level today       Relevant Orders   Ambulatory referral to Urology   Testosterone   Morbid obesity with BMI of 45.0-49.9, adult ([place])    Improving wt loss on overhaul diet Limited exercise due to R torn biceps tendon recovery now post op Start GLP1 therapy and Metformin for newly dx diabetic      Relevant Medications   metFORMIN (GLUCOPHAGE-XR) 500 MG 24 hr tablet   Type 2 diabetes mellitus with other specified complication ([place]) - Primary    New, dx Type 2 Diabetes Uncontrolled W1U 8.4 Complications - Morbid Obesity OSA HLD HYPERTENSION  Plan:  1. Start Metformin XR '500mg'$  daily 2. Sample given GLP1 Ozempic 0.'25mg'$  x 4 week to 0.'5mg'$  x 2 week - f/u message / call to re order Ozempic if covered 3. Encourage improved lifestyle - low carb, low sugar diet, reduce portion size, continue improving regular exercise 4. Future check home CBG OTC or notify us for glucometer if can check coverage 5. Continue ARB, will review Statin in future      Relevant Medications   metFORMIN (GLUCOPHAGE-XR) 500 MG 24 hr tablet   Other Visit Diagnoses     Abnormal glucose       Relevant Orders   POCT HgB A1C (Completed)       Orders Placed This Encounter  Procedures   Testosterone   Ambulatory referral to Urology    Referral Priority:   Routine    Referral Type:   Consultation    Referral Reason:   Specialty Services Required    Requested Specialty:   Urology    Number of Visits Requested:   1   POCT HgB A1C     Meds ordered this encounter  Medications   metFORMIN (GLUCOPHAGE-XR) 500 MG 24 hr tablet    Sig: Take 1 tablet (500 mg total) by mouth daily with breakfast.    Dispense:  90 tablet    Refill:  0      Follow up plan: Return in about  4 months (around 08/12/2022) for 4  months DM A1c.   Nobie Putnam, [facility] 04/13/2022, 8:43 AM

## 2022-04-13 NOTE — Assessment & Plan Note (Signed)
Chronic problem >10 years Prior documentation at [facility] / [facility] Dr Yves Dill - now retiring Goal to restart HRT referral to [facility] now for further management  Check testosterone lab level today

## 2022-04-13 NOTE — Assessment & Plan Note (Signed)
New, dx Type 2 Diabetes Uncontrolled U6J 8.4 Complications - Morbid Obesity OSA HLD HYPERTENSION  Plan:  1. Start Metformin XR '500mg'$  daily 2. Sample given GLP1 Ozempic 0.'25mg'$  x 4 week to 0.'5mg'$  x 2 week - f/u message / call to re order Ozempic if covered 3. Encourage improved lifestyle - low carb, low sugar diet, reduce portion size, continue improving regular exercise 4. Future check home CBG OTC or notify us for glucometer if can check coverage 5. Continue ARB, will review Statin in future

## 2022-04-13 NOTE — Assessment & Plan Note (Signed)
Improving wt loss on overhaul diet Limited exercise due to R torn biceps tendon recovery now post op Start GLP1 therapy and Metformin for newly dx diabetic

## 2022-04-13 NOTE — Patient Instructions (Addendum)
Thank you for coming to the office today.  Start Metformin XR '500mg'$  daily with meal, for 30-90 days as we proceed to the Ozempic sample. For 6 weeks.  Let me know 4-5 weeks and I can re order the Ozempic 0.'5mg'$   Call insurance find cost and coverage of the following - check the following: - Drug Tier, Preferred List, On Formulary - All will require a "Prior Authorization" from [place] first, before you can find out the cost - Find out if there is "Step Therapy" (other medicines required before you can try these)  Once you pick the one you want to try, let me know - we can get a sample ready IF we have it in stock. Then try it - and before running out of medicine, contact me back to order your Rx so we have time to get it processed.  For Pre-Diabetes / Diabetes   1. Ozempic (Semaglutide injection) - start 0.'25mg'$  weekly for 4 weeks then increase to 0.'5mg'$  weekly, sample is 6 doses, re-use the same pen until empty, new needle each dose.   2. Trulicity (Dulaglutide) - once weekly 0.75 (likely we would start) and 1.5 max dose, sample is 2 doses, 1 dose per pen, each pen is a one time use, no visible needle, it is an auto-injector.  -----------------------  Referral to Urology for Low Testosterone and Androgel treatment.  [facility] -1st floor [address] [place],  [place]  [address] Phone: [phone]     Please schedule a Follow-up Appointment to: Return in about 4 months (around 08/12/2022) for 4 months DM A1c.  If you have any other questions or concerns, please feel free to call the office or send a message through [place]. You may also schedule an earlier appointment if necessary.  Additionally, you may be receiving a survey about your experience at our office within a few days to 1 week by e-mail or mail. We value your feedback.  Nobie Putnam, DO [place]

## 2022-04-14 LAB — TESTOSTERONE: Testosterone: 87 ng/dL — ABNORMAL LOW (ref 250–827)

## 2022-05-11 ENCOUNTER — Ambulatory Visit: Payer: BC Managed Care – PPO | Admitting: Urology

## 2022-05-11 ENCOUNTER — Ambulatory Visit (INDEPENDENT_AMBULATORY_CARE_PROVIDER_SITE_OTHER): Payer: BC Managed Care – PPO | Admitting: Internal Medicine

## 2022-05-11 VITALS — BP 170/119 | HR 76 | Resp 16 | Ht 79.0 in | Wt >= 6400 oz

## 2022-05-11 DIAGNOSIS — Z7189 Other specified counseling: Secondary | ICD-10-CM | POA: Diagnosis not present

## 2022-05-11 DIAGNOSIS — G4733 Obstructive sleep apnea (adult) (pediatric): Secondary | ICD-10-CM | POA: Diagnosis not present

## 2022-05-11 NOTE — Progress Notes (Signed)
Sleep Medicine   Office Visit  Patient Name: Darren Robinson DOB: [birth year] MRN [id number]    Chief Complaint: establish care for OSA  Brief History:  Darren Robinson presents for an initial consult for sleep evaluation and to establish care. The patient has a 10 years history of sleep apnea and is currently on a CPAP. Sleep quality is amazing with the CPAP. This is noted most nights. Prior to using a PAP, the patient's bed partner/ family reported the following symptoms:  Loud snoring and witnessed apnea at night. The patient relates the following symptoms currently: Some mask leak due to aging headgear . The patient goes to sleep at 9pm and wakes up at 6:30am. The patient reports a history of psychiatric problems (depression) . The Epworth Sleepiness Score is 5 out of 24 . The patient's STOP-BANG score is 4. The patient relates  Cardiovascular risk factors include: hypertension. The patient is currently on a PAP@ 11 cmH2O. The patient reports using her/his CPAP and feels rested after sleeping with PAP.  The patient reports benefiting from PAP use and would like for Darren Robinson to continue using PAP. Reported sleepiness is  improved. The compliance download shows  100% compliance with an average use time of 10 hours  30 minutes. The AHI is 2.9.   The patient continues to require PAP therapy as a medical necessity in order to eliminate his/her sleep apnea.     ROS  General: (-) fever, (-) chills, (-) night sweat Nose and Sinuses: (-) nasal stuffiness or itchiness, (-) postnasal drip, (-) nosebleeds, (-) sinus trouble. Mouth and Throat: (-) sore throat, (-) hoarseness. Neck: (-) swollen glands, (-) enlarged thyroid, (-) neck pain. Respiratory: - cough, - shortness of breath, - wheezing. Neurologic: - numbness, - tingling. Psychiatric: - anxiety, + depression Sleep behavior: -sleep paralysis -hypnogogic hallucinations -dream enactment      -vivid dreams -cataplexy -night terrors -sleep  walking   Current Medication: Outpatient Encounter Medications as of 05/11/2022  Medication Sig   acetaminophen (TYLENOL) 500 MG tablet Take 1,000-1,500 mg by mouth every 6 (six) hours as needed for moderate pain or headache.   colchicine 0.6 MG tablet Take 1 tablet (0.6 mg total) by mouth daily as needed (gout). May repeat 2nd dose on Day 1. Take for 7-10 days or until resolved.   indomethacin (INDOCIN) 50 MG capsule Take 1 capsule (50 mg total) by mouth 3 (three) times daily as needed. Take with food; may cause GI bleed   metFORMIN (GLUCOPHAGE-XR) 500 MG 24 hr tablet Take 1 tablet (500 mg total) by mouth daily with breakfast.   Multiple Vitamins-Minerals (CENTRUM ULTRA MENS PO) Take by mouth.   naltrexone (DEPADE) 50 MG tablet Take 1 tablet (50 mg total) by mouth daily.   sertraline (ZOLOFT) 100 MG tablet Take 1 tablet (100 mg total) by mouth daily.   valsartan (DIOVAN) 160 MG tablet Take 1 tablet (160 mg total) by mouth daily.   No facility-administered encounter medications on file as of 05/11/2022.    Surgical History: Past Surgical History:  Procedure Laterality Date   APPENDECTOMY     NASAL SEPTOPLASTY W/ TURBINOPLASTY Bilateral 03/23/2017   Procedure: NASAL SEPTOPLASTY WITH TURBINATE REDUCTION;  Surgeon: Beverly Gust, MD;  Location: [facility] ORS;  Service: ENT;  Laterality: Bilateral;    Medical History: Past Medical History:  Diagnosis Date   Essential hypertension, benign 02/11/2015   Fatigue    Gout    Hx of gout 08/05/2015   Hypertension    Obesity  02/11/2015   OSA (obstructive sleep apnea)    Sleep apnea    Vitamin D deficiency disease     Family History: Non contributory to the present illness  Social History: Social History   Socioeconomic History   Marital status: Married    Spouse name: Brandi   Number of children: 2   Years of education: 18   Highest education level: Conservator, museum/gallery (e.g., MA, MS, MEng, MEd, MSW, MBA)  Occupational History   Occupation:  [employer] Chartered loss adjuster: [employer]   Occupation: retired    Comment: Engineer, structural  Tobacco Use   Smoking status: Former    Packs/day: 1.00    Years: 10.00    Total pack years: 10.00    Types: Cigarettes    Quit date: 04/06/2004    Years since quitting: 18.1   Smokeless tobacco: Current    Types: Snuff, Chew    Last attempt to quit: 2018   Tobacco comments:    1 can day for 15 years  Vaping Use   Vaping Use: Never used  Substance and Sexual Activity   Alcohol use: Yes    Alcohol/week: 10.0 standard drinks of alcohol    Types: 10 Standard drinks or equivalent per week    Comment: Social   Drug use: No   Sexual activity: Yes    Partners: Female  Other Topics Concern   Not on file  Social History Narrative   Not on file   Social Determinants of Health   Financial Resource Strain: Low Risk  (01/07/2018)   Overall Financial Resource Strain (CARDIA)    Difficulty of Paying Living Expenses: Not hard at all  Food Insecurity: No Food Insecurity (01/07/2018)   Hunger Vital Sign    Worried About Running Out of Food in the Last Year: Never true    Ran Out of Food in the Last Year: Never true  Transportation Needs: No Transportation Needs (01/07/2018)   PRAPARE - Hydrologist (Medical): No    Lack of Transportation (Non-Medical): No  Physical Activity: Inactive (01/07/2018)   Exercise Vital Sign    Days of Exercise per Week: 0 days    Minutes of Exercise per Session: 0 min  Stress: No Stress Concern Present (01/07/2018)   [place]    Feeling of Stress : Not at all  Social Connections: Moderately Integrated (01/07/2018)   Social Connection and Isolation Panel [NHANES]    Frequency of Communication with Friends and Family: Twice a week    Frequency of Social Gatherings with Friends and Family: Once a week    Attends Religious Services: More than 4 times per year    Active Member of [employer]  or Organizations: No    Attends Archivist Meetings: Never    Marital Status: Married  Human resources officer Violence: Not At Risk (01/07/2018)   Humiliation, Afraid, Rape, and Kick questionnaire    Fear of Current or Ex-Partner: No    Emotionally Abused: No    Physically Abused: No    Sexually Abused: No    Vital Signs: Blood pressure (!) 170/119, pulse 76, resp. rate 16, height 6' 7"$  (2.007 m), weight (!) 437 lb (198.2 kg), SpO2 96 %. Body mass index is 49.23 kg/m.   Examination: General Appearance: The patient is well-developed, well-nourished, and in no distress. Neck Circumference: 55 cm Skin: Gross inspection of skin unremarkable. Head: normocephalic, no gross deformities. Eyes: no  gross deformities noted. ENT: ears appear grossly normal Neurologic: Alert and oriented. No involuntary movements.    STOP BANG RISK ASSESSMENT S (snore) Have you been told that you snore?     NO   T (tired) Are you often tired, fatigued, or sleepy during the day?   NO  O (obstruction) Do you stop breathing, choke, or gasp during sleep? NO   P (pressure) Do you have or are you being treated for high blood pressure? YES   B (BMI) Is your body index greater than 35 kg/m? YES   A (age) Are you 74 years old or older? NO   N (neck) Do you have a neck circumference greater than 16 inches?   YES   G (gender) Are you a male? YES   TOTAL STOP/BANG "YES" ANSWERS 4                                                               A STOP-Bang score of 2 or less is considered low risk, and a score of 5 or more is high risk for having either moderate or severe OSA. For people who score 3 or 4, doctors may need to perform further assessment to determine how likely they are to have OSA.         EPWORTH SLEEPINESS SCALE:  Scale:  (0)= no chance of dozing; (1)= slight chance of dozing; (2)= moderate chance of dozing; (3)= high chance of dozing  Chance  Situtation    Sitting and reading: 0     Watching TV: 0    Sitting Inactive in public: 0    As a passenger in car: 0      Lying down to rest: 3    Sitting and talking: 0    Sitting quielty after lunch: 2    In a car, stopped in traffic: 0   TOTAL SCORE:   5 out of 24   Had a sleep study over 10 years ago   LABS: Recent Results (from the past 2160 hour(s))  Testosterone     Status: Abnormal   Collection Time: 04/13/22  9:13 AM  Result Value Ref Range   Testosterone 87 (L) 250 - 827 ng/dL    Comment: In hypogonadal males, Testosterone, Total, LC/MS/MS, is the recommended assay due to the diminished accuracy of immunoassay at levels below 250 ng/dL. This test code [phone]) must be collected in a red-top tube with no gel.    POCT HgB A1C     Status: Abnormal   Collection Time: 04/13/22  9:13 AM  Result Value Ref Range   Hemoglobin A1C 8.4 (A) 4.0 - 5.6 %    Radiology: No results found.  No results found.  No results found.    Assessment and Plan: Patient Active Problem List   Diagnosis Date Noted   OSA (obstructive sleep apnea) 05/11/2022   CPAP use counseling 05/11/2022   Morbid obesity ([place]) 05/11/2022   Hypogonadism in male 04/13/2022   Type 2 diabetes mellitus with other specified complication ([place]) [id number]   Uncomplicated alcohol dependence ([place]) 10/24/2019   Major depressive disorder, recurrent, moderate ([place]) 10/24/2019   OSA on CPAP 01/03/2019   Attention deficit hyperactivity disorder (ADHD), combined type 01/03/2019   Morbid obesity with BMI of 45.0-49.9,  adult ([place]) 12/27/2017   Gout of left foot 08/10/2016   Alcohol use 08/10/2016   Medication monitoring encounter 08/10/2016   Dyslipidemia 08/05/2015   Elevated serum creatinine 08/05/2015   Hx of gout 08/05/2015   Essential hypertension, benign 02/11/2015   Preventative health care 02/07/2015   1. OSA (obstructive sleep apnea) PLAN OSA:   Pt has a history of OSA, we do not have his sleep studies. He will try to obtain. If  not obtainable, pt will need a repeat PSG to establish diagnosis. Pt is in need of a new machine. Once study is obtained, we will pursue a new machine. He is controlled and compliant 100% with CPAP at 11 cm, AHI is 2.9  2. CPAP use counseling CPAP Counseling: had a lengthy discussion with the patient regarding the importance of PAP therapy in management of the sleep apnea. Patient appears to understand the risk factor reduction and also understands the risks associated with untreated sleep apnea. Patient will try to make a good faith effort to remain compliant with therapy. Also instructed the patient on proper cleaning of the device including the water must be changed daily if possible and use of distilled water is preferred. Patient understands that the machine should be regularly cleaned with appropriate recommended cleaning solutions that do not damage the PAP machine for example given white vinegar and water rinses. Other methods such as ozone treatment may not be as good as these simple methods to achieve cleaning.   3. Morbid obesity ([place]) Obesity Counseling: Had a lengthy discussion regarding patients BMI and weight issues. Patient was instructed on portion control as well as increased activity. Also discussed caloric restrictions with trying to maintain intake less than 2000 Kcal. Discussions were made in accordance with the 5As of weight management. Simple actions such as not eating late and if able to, taking a walk is suggested.       General Counseling: I have discussed the findings of the evaluation and examination with Darren Robinson.  I have also discussed any further diagnostic evaluation thatmay be needed or ordered today. Darren Robinson verbalizes understanding of the findings of todays visit. We also reviewed his medications today and discussed drug interactions and side effects including but not limited excessive drowsiness and altered mental states. We also discussed that there is always a risk not just  to him but also people around him. he has been encouraged to call the office with any questions or concerns that should arise related to todays visit.  No orders of the defined types were placed in this encounter.       I have personally obtained a history, evaluated the patient, evaluated pertinent data, formulated the assessment and plan and placed orders.   This patient was seen today by Tressie Ellis, PA-C in collaboration with Dr. Devona Konig.   Allyne Gee, MD [facility] Diplomate ABMS Pulmonary and Critical Care Medicine Sleep medicine

## 2022-05-11 NOTE — Patient Instructions (Signed)

## 2022-05-20 ENCOUNTER — Encounter: Payer: Self-pay | Admitting: Urology

## 2022-05-20 ENCOUNTER — Ambulatory Visit: Payer: BC Managed Care – PPO | Admitting: Urology

## 2022-05-20 VITALS — BP 156/104 | HR 81 | Ht 79.0 in | Wt >= 6400 oz

## 2022-05-20 DIAGNOSIS — E291 Testicular hypofunction: Secondary | ICD-10-CM | POA: Diagnosis not present

## 2022-05-20 DIAGNOSIS — N529 Male erectile dysfunction, unspecified: Secondary | ICD-10-CM

## 2022-05-20 NOTE — Progress Notes (Signed)
05/20/2022 10:00 AM   Darren Robinson 06/25/1975 [id number]  Referring provider: Olin Hauser, DO [address] [place],  [place] [address]  Chief Complaint  Patient presents with   Hypogonadism    HPI: Darren Robinson is a 47 y.o. male   PMH: Past Medical History:  Diagnosis Date   Essential hypertension, benign 02/11/2015   Fatigue    Gout    Hx of gout 08/05/2015   Hypertension    Obesity 02/11/2015   OSA (obstructive sleep apnea)    Sleep apnea    Vitamin D deficiency disease     Surgical History: Past Surgical History:  Procedure Laterality Date   APPENDECTOMY     NASAL SEPTOPLASTY W/ TURBINOPLASTY Bilateral 03/23/2017   Procedure: NASAL SEPTOPLASTY WITH TURBINATE REDUCTION;  Surgeon: Beverly Gust, MD;  Location: [facility] ORS;  Service: ENT;  Laterality: Bilateral;    Home Medications:  Allergies as of 05/20/2022       Reactions   Androgel [testosterone] Other (See Comments)   Testicular atrophy        Medication List        Accurate as of May 20, 2022 10:00 AM. If you have any questions, ask your nurse or doctor.          acetaminophen 500 MG tablet Commonly known as: TYLENOL Take 1,000-1,500 mg by mouth every 6 (six) hours as needed for moderate pain or headache.   CENTRUM ULTRA MENS PO Take by mouth.   colchicine 0.6 MG tablet Take 1 tablet (0.6 mg total) by mouth daily as needed (gout). May repeat 2nd dose on Day 1. Take for 7-10 days or until resolved.   indomethacin 50 MG capsule Commonly known as: INDOCIN Take 1 capsule (50 mg total) by mouth 3 (three) times daily as needed. Take with food; may cause GI bleed   metFORMIN 500 MG 24 hr tablet Commonly known as: GLUCOPHAGE-XR Take 1 tablet (500 mg total) by mouth daily with breakfast.   naltrexone 50 MG tablet Commonly known as: DEPADE Take 1 tablet (50 mg total) by mouth daily.   sertraline 100 MG tablet Commonly known as: ZOLOFT Take 1 tablet (100 mg total) by  mouth daily.   valsartan 160 MG tablet Commonly known as: DIOVAN Take 1 tablet (160 mg total) by mouth daily.        Allergies:  Allergies  Allergen Reactions   Androgel [Testosterone] Other (See Comments)    Testicular atrophy    Family History: Family History  Problem Relation Age of Onset   Alcohol abuse Mother    Arthritis Mother    Heart disease Mother    Mental illness Mother    Thyroid disease Mother    Diabetes Father    Heart attack Father    Kidney disease Father        On Dialysis   Heart disease Father    Hyperlipidemia Father    Thyroid disease Sister    Hyperlipidemia Sister    Hypertension Sister    Lung disease Maternal Grandfather    Heart attack Paternal Grandmother    Cancer Neg Hx    Stroke Neg Hx     Social History:  reports that he quit smoking about 18 years ago. His smoking use included cigarettes. He has a 10.00 pack-year smoking history. His smokeless tobacco use includes snuff and chew. He reports current alcohol use of about 10.0 standard drinks of alcohol per week. He reports that he does not  use drugs.   Physical Exam: BP (!) 156/104   Pulse 81   Ht 6' 7"$  (2.007 m)   Wt (!) 425 lb (192.8 kg)   BMI 47.88 kg/m   Constitutional:  Alert and oriented, No acute distress. HEENT: [place] AT, moist mucus membranes.  Trachea midline, no masses. Cardiovascular: No clubbing, cyanosis, or edema. Respiratory: Normal respiratory effort, no increased work of breathing. GI: Abdomen is soft, nontender, nondistended, no abdominal masses GU: No CVA tenderness Skin: No rashes, bruises or suspicious lesions. Neurologic: Grossly intact, no focal deficits, moving all 4 extremities. Psychiatric: Normal mood and affect.  Laboratory Data: Lab Results  Component Value Date   WBC 5.4 01/09/2022   HGB 15.8 01/09/2022   HCT 49.5 01/09/2022   MCV 100.0 01/09/2022   PLT 210 01/09/2022    Lab Results  Component Value Date   CREATININE 1.11 01/09/2022     Lab Results  Component Value Date   PSA 0.16 01/09/2022    Lab Results  Component Value Date   TESTOSTERONE 87 (L) 04/13/2022    Lab Results  Component Value Date   HGBA1C 8.4 (A) 04/13/2022    Urinalysis No results found for: "COLORURINE", "APPEARANCEUR", "LABSPEC", "PHURINE", "GLUCOSEU", "HGBUR", "BILIRUBINUR", "KETONESUR", "PROTEINUR", "UROBILINOGEN", "NITRITE", "LEUKOCYTESUR"  No results found for: "LABMICR", "WBCUA", "RBCUA", "LABEPIT", "MUCUS", "BACTERIA"  Pertinent Imaging: *** No results found for this or any previous visit.  No results found for this or any previous visit.  No results found for this or any previous visit.  No results found for this or any previous visit.  No results found for this or any previous visit.  No valid procedures specified. No results found for this or any previous visit.  No results found for this or any previous visit.   Assessment & Plan:    There are no diagnoses linked to this encounter.  No follow-ups on file.  Darren Robinson, [place] [address] [place], [place] [address] [phone]

## 2022-05-21 ENCOUNTER — Encounter: Payer: Self-pay | Admitting: Urology

## 2022-05-21 LAB — TESTOSTERONE: Testosterone: 109 ng/dL — ABNORMAL LOW (ref 264–916)

## 2022-05-21 LAB — PROLACTIN: Prolactin: 6.6 ng/mL (ref 3.9–22.7)

## 2022-05-21 LAB — LUTEINIZING HORMONE: LH: 0.8 m[IU]/mL — ABNORMAL LOW (ref 1.7–8.6)

## 2022-05-23 ENCOUNTER — Encounter: Payer: Self-pay | Admitting: Urology

## 2022-05-26 ENCOUNTER — Other Ambulatory Visit: Payer: Self-pay | Admitting: Urology

## 2022-05-26 DIAGNOSIS — E291 Testicular hypofunction: Secondary | ICD-10-CM

## 2022-05-26 MED ORDER — TESTOSTERONE CYPIONATE 200 MG/ML IM SOLN: 200.0000 mg | INTRAMUSCULAR | 0 refills | Status: DC

## 2022-05-26 NOTE — Progress Notes (Deleted)
Looks like Darren Robinson is not covered by [employer].  Rx testosterone was sent to [facility] pharmacy.  Will need visit for injection training.  If he has done injections in the past would recommend at least an initial visit to demonstrate proper technique.  Schedule lab visit for midcycle testosterone level in 6 weeks

## 2022-05-26 NOTE — Telephone Encounter (Signed)
LMOM for patient to return call.

## 2022-06-02 ENCOUNTER — Encounter: Payer: Self-pay | Admitting: Urology

## 2022-07-18 ENCOUNTER — Other Ambulatory Visit: Payer: Self-pay | Admitting: Urology

## 2022-07-18 ENCOUNTER — Other Ambulatory Visit: Payer: Self-pay | Admitting: Family Medicine

## 2022-07-18 DIAGNOSIS — E1169 Type 2 diabetes mellitus with other specified complication: Secondary | ICD-10-CM

## 2022-07-20 ENCOUNTER — Encounter: Payer: Self-pay | Admitting: *Deleted

## 2022-07-20 NOTE — Telephone Encounter (Signed)
Requested Prescriptions  Pending Prescriptions Disp Refills   metFORMIN (GLUCOPHAGE-XR) 500 MG 24 hr tablet [Pharmacy Med Name: METFORMIN HCL ER 500 MG TABLET] 90 tablet 0    Sig: Take 1 tablet (500 mg total) by mouth daily with breakfast.     Endocrinology:  Diabetes - Biguanides Failed - 07/18/2022 12:23 PM      Failed - HBA1C is between 0 and 7.9 and within 180 days    Hemoglobin A1C  Date Value Ref Range Status  04/13/2022 8.4 (A) 4.0 - 5.6 % Final   Hgb A1c MFr Bld  Date Value Ref Range Status  01/09/2022 8.3 (H) <5.7 % of total Hgb Final    Comment:    For someone without known diabetes, a hemoglobin A1c value of 6.5% or greater indicates that they may have  diabetes and this should be confirmed with a follow-up  test. . For someone with known diabetes, a value <7% indicates  that their diabetes is well controlled and a value  greater than or equal to 7% indicates suboptimal  control. A1c targets should be individualized based on  duration of diabetes, age, comorbid conditions, and  other considerations. . Currently, no consensus exists regarding use of hemoglobin A1c for diagnosis of diabetes for children. .          Failed - B12 Level in normal range and within 720 days    No results found for: "VITAMINB12"       Passed - Cr in normal range and within 360 days    Creat  Date Value Ref Range Status  01/09/2022 1.11 0.60 - 1.29 mg/dL Final         Passed - eGFR in normal range and within 360 days    GFR, Est African American  Date Value Ref Range Status  10/19/2019 83 > OR = 60 mL/min/1.59m2 Final   GFR, Est Non African American  Date Value Ref Range Status  10/19/2019 71 > OR = 60 mL/min/1.24m2 Final   GFR, Estimated  Date Value Ref Range Status  10/23/2020 >60 >60 mL/min Final    Comment:    (NOTE) Calculated using the CKD-EPI Creatinine Equation (2021)    eGFR  Date Value Ref Range Status  01/09/2022 83 > OR = 60 mL/min/1.75m2 Final          Passed - Valid encounter within last 6 months    Recent Outpatient Visits           3 months ago Type 2 diabetes mellitus with other specified complication, without long-term current use of insulin [facility])   [place] [facility] [place], Netta Neat, DO   6 months ago Annual physical exam   [place] [facility] Smitty Cords, DO   1 year ago Annual physical exam   [facility] Smitty Cords, DO   1 year ago Essential hypertension, benign   [place] [facility] Smitty Cords, DO   2 years ago Attention deficit hyperactivity disorder (ADHD), combined type   [place] [facility] Althea Charon, Netta Neat, DO       Future Appointments             In 3 weeks Al[place] Charon, Netta Neat, DO [facility]            Passed - CBC within normal limits and completed in the last 12 months  WBC  Date Value Ref Range Status  01/09/2022 5.4 3.8 - 10.8 Thousand/uL Final   RBC  Date Value Ref Range Status  01/09/2022 4.95 4.20 - 5.80 Million/uL Final   Hemoglobin  Date Value Ref Range Status  01/09/2022 15.8 13.2 - 17.1 g/dL Final  38/01/1750 02.5 12.6 - 17.7 g/dL Final   HCT  Date Value Ref Range Status  01/09/2022 49.5 38.5 - 50.0 % Final   Hematocrit  Date Value Ref Range Status  02/07/2015 44.7 37.5 - 51.0 % Final   MCHC  Date Value Ref Range Status  01/09/2022 31.9 (L) 32.0 - 36.0 g/dL Final   [facility]  Date Value Ref Range Status  01/09/2022 31.9 27.0 - 33.0 pg Final   MCV  Date Value Ref Range Status  01/09/2022 100.0 80.0 - 100.0 fL Final  02/07/2015 90 79 - 97 fL Final   No results found for: "PLTCOUNTKUC", "LABPLAT", "POCPLA" RDW  Date Value Ref Range Status  01/09/2022 14.3 11.0 - 15.0 % Final  02/07/2015 14.1 12.3 - 15.4 % Final

## 2022-08-12 ENCOUNTER — Ambulatory Visit: Payer: Self-pay | Admitting: Family Medicine

## 2022-08-12 ENCOUNTER — Other Ambulatory Visit: Payer: Self-pay | Admitting: Family Medicine

## 2022-08-12 ENCOUNTER — Encounter: Payer: Self-pay | Admitting: Family Medicine

## 2022-08-12 VITALS — BP 148/98 | HR 64 | Ht 79.0 in | Wt >= 6400 oz

## 2022-08-12 DIAGNOSIS — G4733 Obstructive sleep apnea (adult) (pediatric): Secondary | ICD-10-CM

## 2022-08-12 DIAGNOSIS — Z125 Encounter for screening for malignant neoplasm of prostate: Secondary | ICD-10-CM

## 2022-08-12 DIAGNOSIS — E1169 Type 2 diabetes mellitus with other specified complication: Secondary | ICD-10-CM

## 2022-08-12 DIAGNOSIS — I10 Essential (primary) hypertension: Secondary | ICD-10-CM

## 2022-08-12 DIAGNOSIS — E785 Hyperlipidemia, unspecified: Secondary | ICD-10-CM

## 2022-08-12 DIAGNOSIS — E291 Testicular hypofunction: Secondary | ICD-10-CM

## 2022-08-12 DIAGNOSIS — Z0000 Encounter for general adult medical examination without abnormal findings: Secondary | ICD-10-CM

## 2022-08-12 DIAGNOSIS — Z6841 Body Mass Index (BMI) 40.0 and over, adult: Secondary | ICD-10-CM

## 2022-08-12 DIAGNOSIS — Z8739 Personal history of other diseases of the musculoskeletal system and connective tissue: Secondary | ICD-10-CM

## 2022-08-12 LAB — POCT GLYCOSYLATED HEMOGLOBIN (HGB A1C): Hemoglobin A1C: 7.3 % — AB (ref 4.0–5.6)

## 2022-08-12 MED ORDER — METFORMIN HCL ER 500 MG PO TB24
1000.0000 mg | ORAL_TABLET | Freq: Every day | ORAL | 0 refills | Status: DC
Start: 2022-08-12 — End: 2023-01-20

## 2022-08-12 NOTE — Patient Instructions (Addendum)
Thank you for coming to the office today.  Recent Labs    01/09/22 0934 04/13/22 0913 08/12/22 0927  HGBA1C 8.3* 8.4* 7.3*    Wt Readings from Last 3 Encounters:  08/12/22 (!) 420 lb (190.5 kg)  05/20/22 (!) 425 lb (192.8 kg)  05/11/22 (!) 437 lb (198.2 kg)    Type 2 Diabetes ICD10 diagnosis code E11.69  Call insurance find cost and coverage of the following - check the following: - Drug Tier, Preferred List, On Formulary - All will require a "Prior Authorization" from [place] first, before you can find out the cost - Find out if there is "Step Therapy" (other medicines required before you can try these)  Once you pick the one you want to try, let me know - we can get a sample ready IF we have it in stock. Then try it - and before running out of medicine, contact me back to order your Rx so we have time to get it processed.  For Pre-Diabetes / Diabetes   Mounjaro - weekly injection 2. Trulicity (Dulaglutide) weekly injection 3. Victoza (daily injection)  4. Ozempic or Rybelsus (pill form of ozempic)  --------------------------------   Please schedule a Follow-up Appointment to: Return in about 5 months (around 01/12/2023) for 5 month fasting lab only then 1 week later Annual Physical.  If you have any other questions or concerns, please feel free to call the office or send a message through MyChart. You may also schedule an earlier appointment if necessary.  Additionally, you may be receiving a survey about your experience at our office within a few days to 1 week by e-mail or mail. We value your feedback.  Saralyn Pilar, DO [facility], [place]

## 2022-08-12 NOTE — Assessment & Plan Note (Signed)
Improved DM 8.4 to 7.3 on metformin and weight loss Complications - Morbid Obesity OSA HLD HYPERTENSION  Plan:  1. Dose increase Metformin XR 500mg  x 2 = 1000mg  daily 2. Reviewed GLP therapy options again - he will check with this list to see if any coverage is available. We may need referral to clinical pharmacy to help with med assistance/coverage 3. Encourage improved lifestyle - low carb, low sugar diet, reduce portion size, continue improving regular exercise 4. Future check home CBG OTC or notify us for glucometer if can check coverage 5. Continue ARB, will review Statin in future

## 2022-08-12 NOTE — Assessment & Plan Note (Signed)
Improving wt loss, overall 17 lbs in 3 months On Metformin, lifestyle diet Goal to get GLP therapy

## 2022-08-12 NOTE — Assessment & Plan Note (Signed)
Well controlled, chronic OSA on CPAP - Good adherence to CPAP nightly - Continue current CPAP therapy, patient seems to be benefiting from therapy  Already updated machine recently. Followed by Erline Hau

## 2022-08-12 NOTE — Assessment & Plan Note (Signed)
Elevated BP with stressor today - Home BP readings improved   No known complications  Off ACEi Lisinopril   Plan:  1. Continue current BP regimen - Valsartan 160mg  daily, no adjustment yet today 2. Encourage improved lifestyle - low sodium diet, regular exercise 3. Continue monitor BP outside office, bring readings to next visit, if persistently >140/90 or new symptoms notify office sooner

## 2022-08-12 NOTE — Progress Notes (Signed)
Subjective:    Patient ID: Darren Robinson, male    DOB: [birth year], 47 y.o.   MRN: [id number]  Darren Robinson is a 46 y.o. male presenting on 08/12/2022 for Diabetes   HPI  CHRONIC HTN: Reports today stressor elevated BP Current Meds - Valsartan 160mg  daily   Reports good compliance, took med last night, takes nightly. Tolerating well, w/o complaints. Lifestyle: - Diet: improved - Exercise: improving Denies CP, dyspnea, HA, edema, dizziness / lightheadedness  CHRONIC DM, Type 2 Improving lifestyle Due for A1c Meds: Metformin XR 500mg  x 1 daily Currently on ARB Lifestyle: Weight down 17 lbs in 3 months Improving exercise regimen He is working out 20 hours a week He is working part time in Public affairs consultant. Denies hypoglycemia, polyuria, visual changes, numbness or tingling.   Hypogonadism Low Testosterone Low Libido Last lab testosterone still too low 90s > 100s Working with Urology [facility]  OSA, on CPAP - Patient reports prior history of dx OSA and on CPAP, prior to treatment initial symptoms were snoring, daytime sleepiness and fatigue, has had several sleep studies in the past.  -Updated CPAP machine 05/11/22 last visit with Dr Penelope Galas - Today reports that sleep apnea is well controlled. He uses the CPAP machine every night. Tolerates the machine well, and thinks that sleeps better with it and feels good. No new concerns or symptoms.      08/12/2022    8:52 AM 04/13/2022    8:52 AM 01/09/2022    8:37 AM  Depression screen PHQ 2/9  Decreased Interest 2 2 3   Down, Depressed, Hopeless 2 1 2   PHQ - 2 Score 4 3 5   Altered sleeping 2 2 1   Tired, decreased energy 3 3 1   Change in appetite 1 0 2  Feeling bad or failure about yourself  2 0 1  Trouble concentrating 0 3 3  Moving slowly or fidgety/restless 0 0 0  Suicidal thoughts 0 0 0  PHQ-9 Score 12 11 13   Difficult doing work/chores  Extremely dIfficult Very difficult    Social History   Tobacco Use   Smoking  status: Former    Packs/day: 1.00    Years: 10.00    Additional pack years: 0.00    Total pack years: 10.00    Types: Cigarettes    Quit date: 04/06/2004    Years since quitting: 18.3   Smokeless tobacco: Current    Types: Snuff, Chew    Last attempt to quit: 2018   Tobacco comments:    1 can day for 15 years  Vaping Use   Vaping Use: Never used  Substance Use Topics   Alcohol use: Yes    Alcohol/week: 10.0 standard drinks of alcohol    Types: 10 Standard drinks or equivalent per week    Comment: Social   Drug use: No    Review of Systems Per HPI unless specifically indicated above     Objective:    BP (!) 148/98 (BP Location: Left Arm, Cuff Size: Large)   Pulse 64   Ht 6\' 7"  (2.007 m)   Wt (!) 420 lb (190.5 kg)   SpO2 99%   BMI 47.31 kg/m   Wt Readings from Last 3 Encounters:  08/12/22 (!) 420 lb (190.5 kg)  05/20/22 (!) 425 lb (192.8 kg)  05/11/22 (!) 437 lb (198.2 kg)    Physical Exam Vitals and nursing note reviewed.  Constitutional:      General: He is not in acute  distress.    Appearance: He is well-developed. He is obese. He is not diaphoretic.     Comments: Well-appearing, comfortable, cooperative  HENT:     Head: Normocephalic and atraumatic.  Eyes:     General:        Right eye: No discharge.        Left eye: No discharge.     Conjunctiva/sclera: Conjunctivae normal.  Neck:     Thyroid: No thyromegaly.  Cardiovascular:     Rate and Rhythm: Normal rate and regular rhythm.     Pulses: Normal pulses.     Heart sounds: Normal heart sounds. No murmur heard. Pulmonary:     Effort: Pulmonary effort is normal. No respiratory distress.     Breath sounds: Normal breath sounds. No wheezing or rales.  Musculoskeletal:        General: Normal range of motion.     Cervical back: Normal range of motion and neck supple.  Lymphadenopathy:     Cervical: No cervical adenopathy.  Skin:    General: Skin is warm and dry.     Findings: No erythema or rash.   Neurological:     Mental Status: He is alert and oriented to person, place, and time. Mental status is at baseline.  Psychiatric:        Behavior: Behavior normal.     Comments: Well groomed, good eye contact, normal speech and thoughts       Results for orders placed or performed in visit on 08/12/22  POCT HgB A1C  Result Value Ref Range   Hemoglobin A1C 7.3 (A) 4.0 - 5.6 %      Assessment & Plan:   Problem List Items Addressed This Visit     Essential hypertension, benign (Chronic)    Elevated BP with stressor today - Home BP readings improved   No known complications  Off ACEi Lisinopril   Plan:  1. Continue current BP regimen - Valsartan 160mg  daily, no adjustment yet today 2. Encourage improved lifestyle - low sodium diet, regular exercise 3. Continue monitor BP outside office, bring readings to next visit, if persistently >140/90 or new symptoms notify office sooner      Morbid obesity with BMI of 45.0-49.9, adult (HCC)    Improving wt loss, overall 17 lbs in 3 months On Metformin, lifestyle diet Goal to get GLP therapy      Relevant Medications   metFORMIN (GLUCOPHAGE-XR) 500 MG 24 hr tablet   OSA on CPAP    Well controlled, chronic OSA on CPAP - Good adherence to CPAP nightly - Continue current CPAP therapy, patient seems to be benefiting from therapy  Already updated machine recently. Followed by Pulm       Type 2 diabetes mellitus with other specified complication (HCC) - Primary    Improved DM 8.4 to 7.3 on metformin and weight loss Complications - Morbid Obesity OSA HLD HYPERTENSION  Plan:  1. Dose increase Metformin XR 500mg  x 2 = 1000mg  daily 2. Reviewed GLP therapy options again - he will check with this list to see if any coverage is available. We may need referral to clinical pharmacy to help with med assistance/coverage 3. Encourage improved lifestyle - low carb, low sugar diet, reduce portion size, continue improving regular exercise 4.  Future check home CBG OTC or notify us for glucometer if can check coverage 5. Continue ARB, will review Statin in future      Relevant Medications   metFORMIN (GLUCOPHAGE-XR) 500 MG 24  hr tablet   Other Relevant Orders   Urine microalbumin-creatinine with uACR   POCT HgB A1C (Completed)    Meds ordered this encounter  Medications   metFORMIN (GLUCOPHAGE-XR) 500 MG 24 hr tablet    Sig: Take 2 tablets (1,000 mg total) by mouth daily with breakfast.    Dispense:  180 tablet    Refill:  0      Follow up plan: Return in about 5 months (around 01/12/2023) for 5 month fasting lab only then 1 week later Annual Physical.  Future labs ordered for 01/2023  Saralyn Pilar, DO [facility] 08/12/2022, 9:10 AM

## 2022-08-13 LAB — MICROALBUMIN / CREATININE URINE RATIO
Creatinine, Urine: 219 mg/dL (ref 20–320)
Microalb Creat Ratio: 4 mg/g creat (ref <30)
Microalb, Ur: 0.9 mg/dL

## 2022-08-20 ENCOUNTER — Other Ambulatory Visit: Payer: Self-pay | Admitting: *Deleted

## 2022-08-20 DIAGNOSIS — E291 Testicular hypofunction: Secondary | ICD-10-CM

## 2022-08-21 ENCOUNTER — Other Ambulatory Visit: Payer: PRIVATE HEALTH INSURANCE

## 2022-08-21 DIAGNOSIS — E291 Testicular hypofunction: Secondary | ICD-10-CM

## 2022-08-23 ENCOUNTER — Encounter: Payer: Self-pay | Admitting: Urology

## 2022-08-23 LAB — TESTOSTERONE: Testosterone: 203 ng/dL — ABNORMAL LOW (ref 264–916)

## 2022-08-28 ENCOUNTER — Other Ambulatory Visit: Payer: Self-pay | Admitting: Urology

## 2022-08-28 MED ORDER — TESTOSTERONE CYPIONATE 200 MG/ML IM SOLN: 100.0000 mg | INTRAMUSCULAR | 0 refills | Status: DC

## 2022-10-06 ENCOUNTER — Encounter: Payer: Self-pay | Admitting: Family Medicine

## 2022-10-15 ENCOUNTER — Other Ambulatory Visit: Payer: Self-pay

## 2022-10-15 DIAGNOSIS — E291 Testicular hypofunction: Secondary | ICD-10-CM

## 2022-10-16 LAB — TESTOSTERONE: Testosterone: 974 ng/dL — ABNORMAL HIGH (ref 264–916)

## 2022-12-30 ENCOUNTER — Other Ambulatory Visit: Payer: Self-pay | Admitting: Urology

## 2023-01-12 ENCOUNTER — Other Ambulatory Visit: Payer: Self-pay

## 2023-01-12 ENCOUNTER — Other Ambulatory Visit: Payer: Self-pay | Admitting: *Deleted

## 2023-01-12 DIAGNOSIS — Z0000 Encounter for general adult medical examination without abnormal findings: Secondary | ICD-10-CM

## 2023-01-12 DIAGNOSIS — Z8739 Personal history of other diseases of the musculoskeletal system and connective tissue: Secondary | ICD-10-CM

## 2023-01-12 DIAGNOSIS — E291 Testicular hypofunction: Secondary | ICD-10-CM

## 2023-01-12 DIAGNOSIS — Z125 Encounter for screening for malignant neoplasm of prostate: Secondary | ICD-10-CM

## 2023-01-12 DIAGNOSIS — E1169 Type 2 diabetes mellitus with other specified complication: Secondary | ICD-10-CM

## 2023-01-12 DIAGNOSIS — I10 Essential (primary) hypertension: Secondary | ICD-10-CM

## 2023-01-12 DIAGNOSIS — E785 Hyperlipidemia, unspecified: Secondary | ICD-10-CM

## 2023-01-15 ENCOUNTER — Other Ambulatory Visit: Payer: Self-pay

## 2023-01-15 DIAGNOSIS — Z125 Encounter for screening for malignant neoplasm of prostate: Secondary | ICD-10-CM

## 2023-01-15 DIAGNOSIS — E291 Testicular hypofunction: Secondary | ICD-10-CM

## 2023-01-16 LAB — HEMOGLOBIN AND HEMATOCRIT, BLOOD
Hematocrit: 54 % — ABNORMAL HIGH (ref 37.5–51.0)
Hemoglobin: 17.6 g/dL (ref 13.0–17.7)

## 2023-01-16 LAB — PSA: Prostate Specific Ag, Serum: 0.3 ng/mL (ref 0.0–4.0)

## 2023-01-16 LAB — TESTOSTERONE: Testosterone: 933 ng/dL — ABNORMAL HIGH (ref 264–916)

## 2023-01-20 ENCOUNTER — Ambulatory Visit (INDEPENDENT_AMBULATORY_CARE_PROVIDER_SITE_OTHER): Payer: Self-pay | Admitting: Family Medicine

## 2023-01-20 ENCOUNTER — Encounter: Payer: Self-pay | Admitting: Family Medicine

## 2023-01-20 VITALS — BP 160/100 | HR 62 | Ht 79.0 in | Wt >= 6400 oz

## 2023-01-20 DIAGNOSIS — Z6841 Body Mass Index (BMI) 40.0 and over, adult: Secondary | ICD-10-CM

## 2023-01-20 DIAGNOSIS — G4733 Obstructive sleep apnea (adult) (pediatric): Secondary | ICD-10-CM

## 2023-01-20 DIAGNOSIS — E1169 Type 2 diabetes mellitus with other specified complication: Secondary | ICD-10-CM

## 2023-01-20 DIAGNOSIS — Z8739 Personal history of other diseases of the musculoskeletal system and connective tissue: Secondary | ICD-10-CM

## 2023-01-20 DIAGNOSIS — E785 Hyperlipidemia, unspecified: Secondary | ICD-10-CM

## 2023-01-20 DIAGNOSIS — F331 Major depressive disorder, recurrent, moderate: Secondary | ICD-10-CM

## 2023-01-20 DIAGNOSIS — I10 Essential (primary) hypertension: Secondary | ICD-10-CM

## 2023-01-20 MED ORDER — METFORMIN HCL ER 500 MG PO TB24
1000.0000 mg | ORAL_TABLET | Freq: Every day | ORAL | 3 refills | Status: DC
Start: 2023-01-20 — End: 2024-01-24

## 2023-01-20 MED ORDER — VALSARTAN 160 MG PO TABS
160.0000 mg | ORAL_TABLET | Freq: Every day | ORAL | 3 refills | Status: DC
Start: 2023-01-20 — End: 2024-01-24

## 2023-01-20 MED ORDER — AMLODIPINE BESYLATE 10 MG PO TABS
10.0000 mg | ORAL_TABLET | Freq: Every day | ORAL | 3 refills | Status: DC
Start: 2023-01-20 — End: 2024-01-24

## 2023-01-20 MED ORDER — SERTRALINE HCL 100 MG PO TABS
100.0000 mg | ORAL_TABLET | Freq: Every day | ORAL | 3 refills | Status: DC
Start: 2023-01-20 — End: 2024-01-24

## 2023-01-20 NOTE — Progress Notes (Signed)
Subjective:    Patient ID: Darren Robinson, male    DOB: [birth year], 47 y.o.   MRN: [id number]  Darren Robinson is a 47 y.o. male presenting on 01/20/2023 for Annual Exam   HPI  Discussed the use of AI scribe software for clinical note transcription with the patient, who gave verbal consent to proceed.      Here for Annual Physical and Lab Review   Major Depression recurrent Generalized Anxiety Alcohol use - admits heavy alcohol use. He has reduced to beer only and reduced amount. 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 He plans to return and this will improve his mood. He does work with lumber a swell.  CHRONIC HTN: Reports today stressor elevated BP Current Meds - Valsartan 160mg  daily   Reports good compliance, took med last night, takes nightly. Tolerating well, w/o complaints. Lifestyle: - Diet: improved - Exercise: improving Denies CP, dyspnea, HA, edema, dizziness / lightheadedness   CHRONIC DM, Type 2 Due for A1c Meds: Metformin XR 500mg  x 2 daily Currently on ARB He is working part time in Public affairs consultant. Denies hypoglycemia, polyuria, visual changes, numbness or tingling.   Hypogonadism Low Testosterone Followed by [facility] Has elevated HCT now, and high T level, they are holding and adjusting accordingly   OSA, on CPAP - Patient reports prior history of dx OSA and on CPAP, prior to treatment initial symptoms were snoring, daytime sleepiness and fatigue, has had several sleep studies in the past.  -Updated CPAP machine 05/11/22 last visit with Dr Penelope Galas - Today reports that sleep apnea is well controlled. He uses the CPAP machine every night. Tolerates the machine well, and thinks that sleeps better with it and feels good. No new concerns or symptoms.   Health Maintenance: Declines  vaccine     01/20/2023    1:58 PM 08/12/2022    8:52 AM 04/13/2022    8:52 AM  Depression screen PHQ 2/9  Decreased Interest 3 2 2   Down, Depressed, Hopeless 2 2 1   PHQ - 2 Score 5 4 3   Altered sleeping 3 2 2   Tired, decreased energy 2 3 3   Change in appetite 2 1 0  Feeling bad or failure about yourself  3 2 0  Trouble concentrating 1 0 3  Moving slowly or fidgety/restless 0 0 0  Suicidal thoughts 0 0 0  PHQ-9 Score 16 12 11   Difficult doing work/chores Not difficult at all  Extremely dIfficult      01/20/2023    1:59 PM 08/12/2022    8:52 AM 04/13/2022    8:52 AM 01/09/2022    8:37 AM  GAD 7 : Generalized Anxiety Score  Nervous, Anxious, on Edge 3 0 0 0  Control/stop worrying 3 0 1 0  Worry too much - different things 3 0 0 0  Trouble relaxing 3 2 1 3   Restless 3 0 1 3  Easily annoyed or irritable 3 2 3 1   Afraid - awful might happen 3 1 0 0  Total GAD 7 Score 21 5 6 7   Anxiety Difficulty   Somewhat difficult Somewhat difficult     Past Medical History:  Diagnosis Date   Essential hypertension, benign 02/11/2015   Fatigue    Gout    Hx of gout 08/05/2015   Hypertension  Obesity 02/11/2015   OSA (obstructive sleep apnea)    Sleep apnea    Vitamin D deficiency disease    Past Surgical History:  Procedure Laterality Date   APPENDECTOMY     NASAL SEPTOPLASTY W/ TURBINOPLASTY Bilateral 03/23/2017   Procedure: NASAL SEPTOPLASTY WITH TURBINATE REDUCTION;  Surgeon: Linus Salmons, MD;  Location: [facility] ORS;  Service: ENT;  Laterality: Bilateral;   Social History   Socioeconomic History   Marital status: Married    Spouse name: Brandi   Number of children: 2   Years of education: 18   Highest education level: Master's degree (e.g., MA, MS, MEng, MEd, MSW, MBA)  Occupational History   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: [employer]   Occupation: retired    Comment: Emergency planning/management officer  Tobacco Use   Smoking status: Former    Current packs/day: 0.00    Average  packs/day: 1 pack/day for 10.0 years (10.0 ttl pk-yrs)    Types: Cigarettes    Start date: 04/06/1994    Quit date: 04/06/2004    Years since quitting: 18.8   Smokeless tobacco: Current    Types: Snuff, Chew    Last attempt to quit: 2018   Tobacco comments:    1 can day for 15 years  Vaping Use   Vaping status: Never Used  Substance and Sexual Activity   Alcohol use: Yes    Alcohol/week: 10.0 standard drinks of alcohol    Types: 10 Standard drinks or equivalent per week    Comment: Social   Drug use: No   Sexual activity: Yes    Partners: Female  Other Topics Concern   Not on file  Social History Narrative   Not on file   Social Determinants of Health   Financial Resource Strain: Low Risk  (01/07/2018)   Overall Financial Resource Strain (CARDIA)    Difficulty of Paying Living Expenses: Not hard at all  Food Insecurity: No Food Insecurity (01/07/2018)   Hunger Vital Sign    Worried About Running Out of Food in the Last Year: Never true    Ran Out of Food in the Last Year: Never true  Transportation Needs: No Transportation Needs (01/07/2018)   PRAPARE - Administrator, Civil Service (Medical): No    Lack of Transportation (Non-Medical): No  Physical Activity: Inactive (01/07/2018)   Exercise Vital Sign    Days of Exercise per Week: 0 days    Minutes of Exercise per Session: 0 min  Stress: No Stress Concern Present (01/07/2018)   [employer] of Occupational Health - Occupational Stress Questionnaire    Feeling of Stress : Not at all  Social Connections: Moderately Integrated (01/07/2018)   Social Connection and Isolation Panel [NHANES]    Frequency of Communication with Friends and Family: Twice a week    Frequency of Social Gatherings with Friends and Family: Once a week    Attends Religious Services: More than 4 times per year    Active Member of [employer] or Organizations: No    Attends Banker Meetings: Never    Marital Status: Married  Careers information officer Violence: Not At Risk (01/07/2018)   Humiliation, Afraid, Rape, and Kick questionnaire    Fear of Current or Ex-Partner: No    Emotionally Abused: No    Physically Abused: No    Sexually Abused: No   Family History  Problem Relation Age of Onset   Alcohol abuse Mother    Arthritis Mother  Heart disease Mother    Mental illness Mother    Thyroid disease Mother    Diabetes Father    Heart attack Father    Kidney disease Father        On Dialysis   Heart disease Father    Hyperlipidemia Father    Thyroid disease Sister    Hyperlipidemia Sister    Hypertension Sister    Lung disease Maternal Grandfather    Heart attack Paternal Grandmother    Cancer Neg Hx    Stroke Neg Hx    Current Outpatient Medications on File Prior to Visit  Medication Sig   acetaminophen (TYLENOL) 500 MG tablet Take 1,000-1,500 mg by mouth every 6 (six) hours as needed for moderate pain or headache.   colchicine 0.6 MG tablet Take 1 tablet (0.6 mg total) by mouth daily as needed (gout). May repeat 2nd dose on Day 1. Take for 7-10 days or until resolved.   indomethacin (INDOCIN) 50 MG capsule Take 1 capsule (50 mg total) by mouth 3 (three) times daily as needed. Take with food; may cause GI bleed   Multiple Vitamins-Minerals (CENTRUM ULTRA MENS PO) Take by mouth.   testosterone cypionate (DEPOTESTOSTERONE CYPIONATE) 200 MG/ML injection Inject 0.5 mLs (100 mg total) into the muscle once a week.   No current facility-administered medications on file prior to visit.    Review of Systems Per HPI unless specifically indicated above      Objective:    BP (!) 172/110   Pulse 62   Ht 6\' 7"  (2.007 m)   Wt (!) 411 lb (186.4 kg)   SpO2 99%   BMI 46.30 kg/m   Wt Readings from Last 3 Encounters:  01/20/23 (!) 411 lb (186.4 kg)  08/12/22 (!) 420 lb (190.5 kg)  05/20/22 (!) 425 lb (192.8 kg)    Physical Exam Vitals and nursing note reviewed.  Constitutional:      General: He is not in acute  distress.    Appearance: He is well-developed. He is obese. He is not diaphoretic.     Comments: Well-appearing, comfortable, cooperative  HENT:     Head: Normocephalic and atraumatic.  Eyes:     General:        Right eye: No discharge.        Left eye: No discharge.     Conjunctiva/sclera: Conjunctivae normal.  Neck:     Thyroid: No thyromegaly.  Cardiovascular:     Rate and Rhythm: Normal rate and regular rhythm.     Pulses: Normal pulses.     Heart sounds: Normal heart sounds. No murmur heard. Pulmonary:     Effort: Pulmonary effort is normal. No respiratory distress.     Breath sounds: Normal breath sounds. No wheezing or rales.  Musculoskeletal:        General: Normal range of motion.     Cervical back: Normal range of motion and neck supple.  Lymphadenopathy:     Cervical: No cervical adenopathy.  Skin:    General: Skin is warm and dry.     Findings: No erythema or rash.  Neurological:     Mental Status: He is alert and oriented to person, place, and time. Mental status is at baseline.  Psychiatric:        Behavior: Behavior normal.     Comments: Well groomed, good eye contact, normal speech and thoughts       Results for orders placed or performed in visit on 01/15/23  Testosterone  Result Value Ref Range   Testosterone 933 (H) 264 - 916 ng/dL  PSA  Result Value Ref Range   Prostate Specific Ag, Serum 0.3 0.0 - 4.0 ng/mL  Hemoglobin and Hematocrit, Blood  Result Value Ref Range   Hemoglobin 17.6 13.0 - 17.7 g/dL   Hematocrit 16.1 (H) 09.6 - 51.0 %      Assessment & Plan:   Problem List Items Addressed This Visit     Essential hypertension, benign - Primary (Chronic)   Relevant Medications   valsartan (DIOVAN) 160 MG tablet   amLODipine (NORVASC) 10 MG tablet   Other Relevant Orders   COMPLETE METABOLIC PANEL WITH GFR   Dyslipidemia   Relevant Orders   Lipid panel   TSH   Hx of gout   Relevant Orders   Uric acid   Major depressive disorder,  recurrent, moderate (HCC)   Relevant Medications   sertraline (ZOLOFT) 100 MG tablet   Morbid obesity with BMI of 45.0-49.9, adult (HCC)   Relevant Medications   metFORMIN (GLUCOPHAGE-XR) 500 MG 24 hr tablet   Other Relevant Orders   Lipid panel   COMPLETE METABOLIC PANEL WITH GFR   OSA on CPAP   Type 2 diabetes mellitus with other specified complication (HCC)   Relevant Medications   valsartan (DIOVAN) 160 MG tablet   metFORMIN (GLUCOPHAGE-XR) 500 MG 24 hr tablet   Other Relevant Orders   Hemoglobin A1c   Updated Health Maintenance information labs ordered Encouraged improvement to lifestyle with diet and exercise Goal of weight loss  Assessment and Plan    Polycythemia Elevated hematocrit (54) likely secondary to testosterone supplementation. Discussed the risks of blood thickening and potential need for phlebotomy. -Stop testosterone supplementation as advised by urologist. -Perform phlebotomy today as part of routine blood draw.  Hypertension Elevated blood pressure today, currently on Valsartan 160mg  daily. -Add Amlodipine 10mg  daily -Continue Valsartan 160mg  daily. Consider dose inc to 320 in future  Depression major recurrent Anxiety -Continue Sertraline 100mg  Agree with plan to resume fulfilling work  Type 2 Diabetes Mellitus Currently on Metformin XR 500mg  x 2 = 1000mg  daily -Continue Metformin, dose not specified in conversation. -Consider newer medications GLP for blood sugar control once insurance coverage is established.  Phlegm production Reports increased phlegm production, no signs of respiratory distress on examination. -Continue use of incentive spirometer. -Consider use of nasal spray to reduce mucus production.  Gout No recent flare-ups, currently has medication on an as-needed basis. -Continue current management, patient to contact office if medication is needed.  General Health Maintenance / Followup Plans -Perform routine blood draw today,  including checks for gout level, thyroid function, and chemistry. -Consider CT scan of the chest for heart blockages once insurance coverage is established. -Schedule follow-up appointment in four months (February 2025).         Meds ordered this encounter  Medications   valsartan (DIOVAN) 160 MG tablet    Sig: Take 1 tablet (160 mg total) by mouth daily.    Dispense:  90 tablet    Refill:  3   amLODipine (NORVASC) 10 MG tablet    Sig: Take 1 tablet (10 mg total) by mouth daily.    Dispense:  90 tablet    Refill:  3   sertraline (ZOLOFT) 100 MG tablet    Sig: Take 1 tablet (100 mg total) by mouth daily.    Dispense:  90 tablet    Refill:  3   metFORMIN (GLUCOPHAGE-XR) 500 MG 24 hr  tablet    Sig: Take 2 tablets (1,000 mg total) by mouth daily with breakfast.    Dispense:  180 tablet    Refill:  3      Follow up plan: Return in about 4 months (around 05/23/2023) for 4 month follow-up DM A1c, HTN, Weight.  Saralyn Pilar, DO [facility] 01/20/2023, 1:49 PM

## 2023-01-20 NOTE — Patient Instructions (Addendum)
Thank you for coming to the office today.     Please schedule a Follow-up Appointment to: Return in about 4 months (around 05/23/2023) for 4 month follow-up DM A1c, HTN, Weight.  If you have any other questions or concerns, please feel free to call the office or send a message through MyChart. You may also schedule an earlier appointment if necessary.  Additionally, you may be receiving a survey about your experience at our office within a few days to 1 week by e-mail or mail. We value your feedback.  Saralyn Pilar, DO [facility], [place]

## 2023-01-21 LAB — COMPLETE METABOLIC PANEL WITH GFR
AG Ratio: 1.6 (calc) (ref 1.0–2.5)
ALT: 61 U/L — ABNORMAL HIGH (ref 9–46)
AST: 32 U/L (ref 10–40)
Albumin: 4.6 g/dL (ref 3.6–5.1)
Alkaline phosphatase (APISO): 58 U/L (ref 36–130)
BUN: 12 mg/dL (ref 7–25)
CO2: 29 mmol/L (ref 20–32)
Calcium: 9.7 mg/dL (ref 8.6–10.3)
Chloride: 101 mmol/L (ref 98–110)
Creat: 1.25 mg/dL (ref 0.60–1.29)
Globulin: 2.9 g/dL (ref 1.9–3.7)
Glucose, Bld: 104 mg/dL — ABNORMAL HIGH (ref 65–99)
Potassium: 4.8 mmol/L (ref 3.5–5.3)
Sodium: 138 mmol/L (ref 135–146)
Total Bilirubin: 0.7 mg/dL (ref 0.2–1.2)
Total Protein: 7.5 g/dL (ref 6.1–8.1)
eGFR: 72 mL/min/{1.73_m2} (ref >=60)

## 2023-01-21 LAB — LIPID PANEL
Cholesterol: 247 mg/dL — ABNORMAL HIGH (ref <200)
HDL: 47 mg/dL (ref >=40)
LDL Cholesterol (Calc): 175 mg/dL — ABNORMAL HIGH
Non-HDL Cholesterol (Calc): 200 mg/dL — ABNORMAL HIGH (ref <130)
Total CHOL/HDL Ratio: 5.3 (calc) — ABNORMAL HIGH (ref <5.0)
Triglycerides: 118 mg/dL (ref <150)

## 2023-01-21 LAB — HEMOGLOBIN A1C
Hgb A1c MFr Bld: 7 %{Hb} — ABNORMAL HIGH (ref <5.7)
Mean Plasma Glucose: 154 mg/dL
eAG (mmol/L): 8.5 mmol/L

## 2023-01-21 LAB — URIC ACID: Uric Acid, Serum: 6.7 mg/dL (ref 4.0–8.0)

## 2023-01-21 LAB — TSH: TSH: 4 m[IU]/L (ref 0.40–4.50)

## 2023-01-27 ENCOUNTER — Encounter: Payer: Self-pay | Admitting: Urology

## 2023-01-27 ENCOUNTER — Ambulatory Visit: Payer: Self-pay | Admitting: Urology

## 2023-01-27 ENCOUNTER — Ambulatory Visit (INDEPENDENT_AMBULATORY_CARE_PROVIDER_SITE_OTHER): Payer: Self-pay | Admitting: Urology

## 2023-01-27 VITALS — BP 176/108 | HR 66 | Ht 79.0 in | Wt >= 6400 oz

## 2023-01-27 DIAGNOSIS — E291 Testicular hypofunction: Secondary | ICD-10-CM

## 2023-01-27 DIAGNOSIS — N529 Male erectile dysfunction, unspecified: Secondary | ICD-10-CM

## 2023-01-27 DIAGNOSIS — D751 Secondary polycythemia: Secondary | ICD-10-CM

## 2023-01-27 NOTE — Progress Notes (Signed)
I, Darren Robinson, acting as a scribe for Darren Altes, MD., have documented all relevant documentation on the behalf of Darren Altes, MD, as directed by Darren Altes, MD while in the presence of Darren Altes, MD.  01/27/2023 4:34 PM   [name] 1975-04-26 [id number]  Referring provider: Smitty Cords, DO [address] [place],  [place] [address]  Chief Complaint  Patient presents with   Hypogonadism   Urologic history: 1.  Hypogonadism Recent testosterone level significant low at 87  HPI: Darren Robinson is a 47 y.o. male presents for 6 month follow-up visit.  Started TRT 100 mg testosterone SQ weekly approximately 6 months ago for long-standing hypogonadism with symptoms of significant tiredness/fatigue, decreased strength/endurance, depressed mood, decreased libido, and erectile dysfunction.  He had marked improvement and his symptoms on TRT. Labs 01/15/2023 testosterone 933, PSA 0.3, H/H was 17.6-54   PMH: Past Medical History:  Diagnosis Date   Essential hypertension, benign 02/11/2015   Fatigue    Gout    Hx of gout 08/05/2015   Hypertension    Obesity 02/11/2015   OSA (obstructive sleep apnea)    Sleep apnea    Vitamin D deficiency disease     Surgical History: Past Surgical History:  Procedure Laterality Date   APPENDECTOMY     NASAL SEPTOPLASTY W/ TURBINOPLASTY Bilateral 03/23/2017   Procedure: NASAL SEPTOPLASTY WITH TURBINATE REDUCTION;  Surgeon: Linus Salmons, MD;  Location: [facility] ORS;  Service: ENT;  Laterality: Bilateral;    Home Medications:  Allergies as of 01/27/2023       Reactions   Androgel [testosterone] Other (See Comments)   Testicular atrophy        Medication List        Accurate as of January 27, 2023  4:34 PM. If you have any questions, ask your nurse or doctor.          acetaminophen 500 MG tablet Commonly known as: TYLENOL Take 1,000-1,500 mg by mouth every 6 (six) hours as needed for  moderate pain or headache.   amLODipine 10 MG tablet Commonly known as: NORVASC Take 1 tablet (10 mg total) by mouth daily.   CENTRUM ULTRA MENS PO Take by mouth.   colchicine 0.6 MG tablet Take 1 tablet (0.6 mg total) by mouth daily as needed (gout). May repeat 2nd dose on Day 1. Take for 7-10 days or until resolved.   indomethacin 50 MG capsule Commonly known as: INDOCIN Take 1 capsule (50 mg total) by mouth 3 (three) times daily as needed. Take with food; may cause GI bleed   metFORMIN 500 MG 24 hr tablet Commonly known as: GLUCOPHAGE-XR Take 2 tablets (1,000 mg total) by mouth daily with breakfast.   sertraline 100 MG tablet Commonly known as: ZOLOFT Take 1 tablet (100 mg total) by mouth daily.   testosterone cypionate 200 MG/ML injection Commonly known as: DEPOTESTOSTERONE CYPIONATE Inject 0.5 mLs (100 mg total) into the muscle once a week.   valsartan 160 MG tablet Commonly known as: DIOVAN Take 1 tablet (160 mg total) by mouth daily.        Allergies:  Allergies  Allergen Reactions   Androgel [Testosterone] Other (See Comments)    Testicular atrophy    Family History: Family History  Problem Relation Age of Onset   Alcohol abuse Mother    Arthritis Mother    Heart disease Mother    Mental illness Mother    Thyroid disease  Mother    Diabetes Father    Heart attack Father    Kidney disease Father        On Dialysis   Heart disease Father    Hyperlipidemia Father    Thyroid disease Sister    Hyperlipidemia Sister    Hypertension Sister    Lung disease Maternal Grandfather    Heart attack Paternal Grandmother    Cancer Neg Hx    Stroke Neg Hx     Social History:  reports that he quit smoking about 18 years ago. His smoking use included cigarettes. He started smoking about 28 years ago. He has a 10 pack-year smoking history. His smokeless tobacco use includes snuff and chew. He reports current alcohol use of about 10.0 standard drinks of alcohol per  week. He reports that he does not use drugs.   Physical Exam: BP (!) 176/108   Pulse 66   Ht 6\' 7"  (2.007 m)   Wt (!) 410 lb (186 kg)   BMI 46.19 kg/m   Constitutional:  Alert and oriented, No acute distress. HEENT: [place] AT, moist mucus membranes.  Trachea midline, no masses. Cardiovascular: No clubbing, cyanosis, or edema. Respiratory: Normal respiratory effort, no increased work of breathing. GI: Abdomen is soft, nontender, nondistended, no abdominal masses Skin: No rashes, bruises or suspicious lesions. Neurologic: Grossly intact, no focal deficits, moving all 4 extremities. Psychiatric: Normal mood and affect.   Assessment & Plan:    1. 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  He is able to donate blood Lab visit 6 months testosterone and H/H.  1 year office visit with PSA, H/H, and testosterone level.  [facility] [address], [address] [place], [place] [address] [phone]

## 2023-01-28 ENCOUNTER — Encounter: Payer: Self-pay | Admitting: Urology

## 2023-03-10 ENCOUNTER — Other Ambulatory Visit: Payer: Self-pay

## 2023-05-11 ENCOUNTER — Other Ambulatory Visit: Payer: Self-pay | Admitting: Family Medicine

## 2023-05-11 ENCOUNTER — Other Ambulatory Visit: Payer: Self-pay | Admitting: Urology

## 2023-05-11 DIAGNOSIS — Z8739 Personal history of other diseases of the musculoskeletal system and connective tissue: Secondary | ICD-10-CM

## 2023-05-11 DIAGNOSIS — M10471 Other secondary gout, right ankle and foot: Secondary | ICD-10-CM

## 2023-05-12 ENCOUNTER — Encounter: Payer: Self-pay | Admitting: *Deleted

## 2023-05-12 ENCOUNTER — Telehealth: Payer: Self-pay | Admitting: Urology

## 2023-05-12 NOTE — Telephone Encounter (Signed)
 Would you call Mr. Darren Robinson and ask him if he was able to donate blood since he last saw Dr. Cherylene Corrente?  His last hemoglobin was elevated and I cannot refill his testosterone  prescription until he donates blood.

## 2023-05-12 NOTE — Telephone Encounter (Signed)
 Sent patient a Well[employer] geologist.

## 2023-05-13 NOTE — Telephone Encounter (Signed)
 Requested medications are due for refill today.  yes  Requested medications are on the active medications list.  yes  Last refill. 01/24/2021  Future visit scheduled.   yes  Notes to clinic.  Labs are expired.    Requested Prescriptions  Pending Prescriptions Disp Refills   indomethacin  (INDOCIN ) 50 MG capsule [Pharmacy Med Name: INDOMETHACIN  50 MG CAPSULE] 21 capsule 0    Sig: Take 1 capsule (50 mg total) by mouth 3 (three) times daily as needed. Take with food; may cause GI bleed     Analgesics:  NSAIDS Failed - 05/13/2023  9:32 AM      Failed - Manual Review: Labs are only required if the patient has taken medication for more than 8 weeks.      Failed - PLT in normal range and within 360 days    Platelets  Date Value Ref Range Status  01/09/2022 210 140 - 400 Thousand/uL Final  02/07/2015 171 150 - 379 x10E3/uL Final         Failed - HCT in normal range and within 360 days    Hematocrit  Date Value Ref Range Status  01/15/2023 54.0 (H) 37.5 - 51.0 % Final         Passed - Cr in normal range and within 360 days    Creat  Date Value Ref Range Status  01/20/2023 1.25 0.60 - 1.29 mg/dL Final   Creatinine, Urine  Date Value Ref Range Status  08/12/2022 219 20 - 320 mg/dL Final         Passed - HGB in normal range and within 360 days    Hemoglobin  Date Value Ref Range Status  01/15/2023 17.6 13.0 - 17.7 g/dL Final         Passed - eGFR is 30 or above and within 360 days    GFR, Est African American  Date Value Ref Range Status  10/19/2019 83 > OR = 60 mL/min/1.70m2 Final   GFR, Est Non African American  Date Value Ref Range Status  10/19/2019 71 > OR = 60 mL/min/1.29m2 Final   GFR, Estimated  Date Value Ref Range Status  10/23/2020 >60 >60 mL/min Final    Comment:    (NOTE) Calculated using the CKD-EPI Creatinine Equation (2021)    eGFR  Date Value Ref Range Status  01/20/2023 72 > OR = 60 mL/min/1.79m2 Final         Passed - Patient is not pregnant       Passed - Valid encounter within last 12 months    Recent Outpatient Visits           3 months ago Essential hypertension, benign   [place] [facility] [place], Marsa PARAS, DO   9 months ago Type 2 diabetes mellitus with other specified complication, without long-term current use of insulin [facility])   [place] [facility] [place], Marsa PARAS, DO   1 year ago Type 2 diabetes mellitus with other specified complication, without long-term current use of insulin [facility])   [place] [facility] [place], Marsa PARAS, DO   1 year ago Annual physical exam   [facility] [place] Marsa PARAS, DO   2 years ago Annual physical exam   [facility] [place] Marsa PARAS, DO       Future Appointments             In 1 week [place] Marsa  J, DO [place] [facility]   In 1 week [place], Marsa PARAS, DO [facility]   In 8 months Stoioff, Glendia BROCKS, MD [facility]

## 2023-05-24 ENCOUNTER — Ambulatory Visit (INDEPENDENT_AMBULATORY_CARE_PROVIDER_SITE_OTHER): Payer: 59 | Admitting: Family Medicine

## 2023-05-24 ENCOUNTER — Encounter: Payer: Self-pay | Admitting: Family Medicine

## 2023-05-24 VITALS — BP 140/88 | HR 73 | Ht 79.0 in | Wt 398.0 lb

## 2023-05-24 DIAGNOSIS — E1169 Type 2 diabetes mellitus with other specified complication: Secondary | ICD-10-CM | POA: Diagnosis not present

## 2023-05-24 DIAGNOSIS — Z7984 Long term (current) use of oral hypoglycemic drugs: Secondary | ICD-10-CM | POA: Diagnosis not present

## 2023-05-24 DIAGNOSIS — F1021 Alcohol dependence, in remission: Secondary | ICD-10-CM

## 2023-05-24 DIAGNOSIS — M778 Other enthesopathies, not elsewhere classified: Secondary | ICD-10-CM | POA: Diagnosis not present

## 2023-05-24 DIAGNOSIS — Z6841 Body Mass Index (BMI) 40.0 and over, adult: Secondary | ICD-10-CM

## 2023-05-24 DIAGNOSIS — F331 Major depressive disorder, recurrent, moderate: Secondary | ICD-10-CM

## 2023-05-24 LAB — POCT GLYCOSYLATED HEMOGLOBIN (HGB A1C): Hemoglobin A1C: 6.9 % — AB (ref 4.0–5.6)

## 2023-05-24 NOTE — Progress Notes (Signed)
 Subjective:    Patient ID: Darren Robinson, male    DOB: 1975-08-24, 48 y.o.   MRN: [id number]  Darren Robinson is a 48 y.o. male presenting on 05/24/2023 for Diabetes   HPI  Discussed the use of AI scribe software for clinical note transcription with the patient, who gave verbal consent to proceed.  History of Present Illness   Darren Robinson is a 48 year old male with diabetes who presents for a sugar check and weight management.  He has lost weight, reducing from 410 pounds in October to 398 pounds currently, attributing this to lifestyle changes including quitting alcohol since January 1st. His clothes fit better and he is able to wear suits he hasn't worn in three years.  He notes a change in bowel habits, with darker stools and experiencing loose stools when he takes metformin in the evening instead of the morning.  He describes right elbow pain particularly affecting his ability to lift weights. This pain has been present since mid-December and is located at the back of the elbow joint. He takes ibuprofen, four pills at once, before workouts to manage the pain. Asks about tendonitis and tennis elbow  He experiences sciatica, which affects his sleep quality, and notes that he has not been sleeping well since quitting alcohol.  He describes the first three weeks without alcohol as particularly challenging, with difficulty sleeping and mood changes, but reports improvement in his overall condition. He has started a new job and is not experiencing cravings.      Major Depression recurrent Generalized Anxiety Improving since quit alcohol On medication currently  CHRONIC HTN: He has issues with blood pressure, recorded at 142/90, an improvement from previous readings. He has difficulty exercising due to right elbow pain, which affects his ability to lift weights. Current Meds - Valsartan 160mg  daily, Amlodipine 10mg  daily Reports good compliance, took med last night, takes  nightly. Tolerating well, w/o complaints. Lifestyle: - Diet: improved - Exercise: improving Denies CP, dyspnea, HA, edema, dizziness / lightheadedness   CHRONIC DM, Type 2 He is here for a follow-up regarding his diabetes management. His recent HbA1c level is 6.9, down from 7.0 previously, and has been steadily decreasing over the past year from 8.3. He is currently taking 1000 mg of extended-release metformin twice daily with breakfast. Meds: Metformin XR 500mg  x 2 daily Currently on ARB He is working part time in [employer]. Denies hypoglycemia, polyuria, visual changes, numbness or tingling.       05/24/2023    2:36 PM 01/20/2023    1:58 PM 08/12/2022    8:52 AM  Depression screen PHQ 2/9  Decreased Interest 2 3 2   Down, Depressed, Hopeless 2 2 2   PHQ - 2 Score 4 5 4   Altered sleeping 2 3 2   Tired, decreased energy 3 2 3   Change in appetite 3 2 1   Feeling bad or failure about yourself  1 3 2   Trouble concentrating 1 1 0  Moving slowly or fidgety/restless 0 0 0  Suicidal thoughts 0 0 0  PHQ-9 Score 14 16 12   Difficult doing work/chores Not difficult at all Not difficult at all        05/24/2023    2:36 PM 01/20/2023    1:59 PM 08/12/2022    8:52 AM 04/13/2022    8:52 AM  GAD 7 : Generalized Anxiety Score  Nervous, Anxious, on Edge 0 3 0 0  Control/stop worrying 0 3 0 1  Worry too much - different things 0 3 0 0  Trouble relaxing 0 3 2 1   Restless 0 3 0 1  Easily annoyed or irritable 1 3 2 3   Afraid - awful might happen 0 3 1 0  Total GAD 7 Score 1 21 5 6   Anxiety Difficulty Not difficult at all   Somewhat difficult    Social History   Tobacco Use   Smoking status: Former    Current packs/day: 0.00    Average packs/day: 1 pack/day for 10.0 years (10.0 ttl pk-yrs)    Types: Cigarettes    Start date: 04/06/1994    Quit date: 04/06/2004    Years since quitting: 19.1   Smokeless tobacco: Current    Types: Snuff, Chew    Last attempt to quit: 2018   Tobacco  comments:    1 can day for 15 years  Vaping Use   Vaping status: Never Used  Substance Use Topics   Alcohol use: Yes    Alcohol/week: 10.0 standard drinks of alcohol    Types: 10 Standard drinks or equivalent per week    Comment: Social   Drug use: No    Review of Systems Per HPI unless specifically indicated above     Objective:    BP (!) 140/88 (BP Location: Left Arm, Cuff Size: Large)   Pulse 73   Ht 6\' 7"  (2.007 m)   Wt (!) 398 lb (180.5 kg)   SpO2 96%   BMI 44.84 kg/m   Wt Readings from Last 3 Encounters:  05/24/23 (!) 398 lb (180.5 kg)  01/27/23 (!) 410 lb (186 kg)  01/20/23 (!) 411 lb (186.4 kg)    Physical Exam Vitals and nursing note reviewed.  Constitutional:      General: He is not in acute distress.    Appearance: He is well-developed. He is obese. He is not diaphoretic.     Comments: Well-appearing, comfortable, cooperative  HENT:     Head: Normocephalic and atraumatic.  Eyes:     General:        Right eye: No discharge.        Left eye: No discharge.     Conjunctiva/sclera: Conjunctivae normal.  Neck:     Thyroid: No thyromegaly.  Cardiovascular:     Rate and Rhythm: Normal rate and regular rhythm.     Pulses: Normal pulses.     Heart sounds: Normal heart sounds. No murmur heard. Pulmonary:     Effort: Pulmonary effort is normal. No respiratory distress.     Breath sounds: Normal breath sounds. No wheezing or rales.  Musculoskeletal:        General: Normal range of motion.     Cervical back: Normal range of motion and neck supple.     Comments: R elbow localized discomfort over tricep tendon at elbow  Lymphadenopathy:     Cervical: No cervical adenopathy.  Skin:    General: Skin is warm and dry.     Findings: No erythema or rash.  Neurological:     Mental Status: He is alert and oriented to person, place, and time. Mental status is at baseline.  Psychiatric:        Behavior: Behavior normal.     Comments: Well groomed, good eye contact,  normal speech and thoughts     Results for orders placed or performed in visit on 05/24/23  POCT glycosylated hemoglobin (Hb A1C)   Collection Time: 05/24/23  2:47 PM  Result Value Ref  Range   Hemoglobin A1C 6.9 (A) 4.0 - 5.6 %   HbA1c POC (<> result, manual entry)     HbA1c, POC (prediabetic range)     HbA1c, POC (controlled diabetic range)        Assessment & Plan:   Problem List Items Addressed This Visit     Alcohol dependence in early full remission (HCC)   Major depressive disorder, recurrent, moderate (HCC)   Morbid obesity with BMI of 40.0-44.9, adult (HCC)   Type 2 diabetes mellitus with other specified complication (HCC) - Primary   Relevant Orders   POCT glycosylated hemoglobin (Hb A1C) (Completed)   Other Visit Diagnoses       Long term current use of oral hypoglycemic drug         Tendonitis of elbow, right           Type 2 Diabetes Mellitus Improved glycemic control with HbA1c of 6.9, down from 7.0. Patient reports adherence to Metformin 1000mg  twice daily. Noted side effect of loose stools, particularly when medication is taken in the evening. -Continue Metformin 1000mg  twice daily. Discuss future GLP1 therapy once on insurance coverage -Check HbA1c in October 2025.  Morbid Obesity BMI >44 Weight Management Patient has lost approximately 12 pounds since October and reports improved fit of clothing. Patient has a goal weight of 315-320 pounds by the end of the year. -Encourage continued efforts in weight loss. Future GLP1 therapy if indicated  Hypertension Blood pressure reading of 142/90, improved from previous readings. -Continue current management and monitor blood pressure.  Triceps Tendonitis Patient reports right elbow pain, particularly with certain movements. Repetition gym exercises. He was worried about tennis elbow but history and exam does not support tennis elbow. Current management includes ibuprofen 800mg  as needed. -Recommend Voltaren  cream applied to the affected area up to four times daily. -Consider referral to a sports medicine specialist if symptoms persist.  Major Depression recurrent moderate Improved on SSRI therapy continue Sertraline 100mg   Alcohol Cessation Patient reports cessation of alcohol since January 1st, with noted improvements in appetite and cravings. -Encourage continued abstinence from alcohol.  Follow-up Next appointment scheduled for October 2025.         Orders Placed This Encounter  Procedures   POCT glycosylated hemoglobin (Hb A1C)    No orders of the defined types were placed in this encounter.   Follow up plan: Return in about 8 months (around 01/21/2024) for 8 month Annual Physical AM apt fasting lab AFTER.   Saralyn Pilar, DO [facility] 05/24/2023, 2:45 PM

## 2023-05-24 NOTE — Patient Instructions (Addendum)
 Thank you for coming to the office today.  Recent Labs    08/12/22 0927 01/20/23 1410 05/24/23 1447  HGBA1C 7.3* 7.0* 6.9*   Likely Triceps Tendonitis  START anti inflammatory topical - OTC Voltaren (generic Diclofenac) topical 2-4 times a day as needed for pain swelling of affected joint for 1-2 weeks or longer.  Mix and match with Ibuprofen   Please schedule a Follow-up Appointment to: Return in about 8 months (around 01/21/2024) for 8 month Annual Physical AM apt fasting lab AFTER.  If you have any other questions or concerns, please feel free to call the office or send a message through MyChart. You may also schedule an earlier appointment if necessary.  Additionally, you may be receiving a survey about your experience at our office within a few days to 1 week by e-mail or mail. We value your feedback.  Saralyn Pilar, DO [facility], [place]

## 2023-05-26 ENCOUNTER — Ambulatory Visit: Payer: Self-pay | Admitting: Family Medicine

## 2023-06-28 ENCOUNTER — Ambulatory Visit (INDEPENDENT_AMBULATORY_CARE_PROVIDER_SITE_OTHER): Admitting: Internal Medicine

## 2023-06-28 ENCOUNTER — Encounter: Payer: Self-pay | Admitting: Internal Medicine

## 2023-06-28 VITALS — BP 140/90 | HR 70 | Ht 79.0 in | Wt 391.5 lb

## 2023-06-28 DIAGNOSIS — J301 Allergic rhinitis due to pollen: Secondary | ICD-10-CM

## 2023-06-28 DIAGNOSIS — E1169 Type 2 diabetes mellitus with other specified complication: Secondary | ICD-10-CM

## 2023-06-28 DIAGNOSIS — Z7984 Long term (current) use of oral hypoglycemic drugs: Secondary | ICD-10-CM | POA: Diagnosis not present

## 2023-06-28 DIAGNOSIS — J040 Acute laryngitis: Secondary | ICD-10-CM | POA: Diagnosis not present

## 2023-06-28 MED ORDER — PREDNISONE 10 MG PO TABS
10.0000 mg | ORAL_TABLET | Freq: Every day | ORAL | 0 refills | Status: DC
Start: 2023-06-28 — End: 2024-01-24

## 2023-06-28 NOTE — Patient Instructions (Signed)
Laryngitis  Laryngitis is irritation and swelling (inflammation) of your vocal cords. It causes your voice to sound hoarse and may cause you to lose your voice. Depending on the cause, this condition may go away after a short time or may last for more than 3 weeks. Treatment often involves resting your voice and using medicines to soothe your throat. What are the causes? Laryngitis that lasts for a short time may be caused by: An infection caused by a virus. Lots of talking, yelling, or singing. This is also called vocal strain. An infection caused by bacteria. Laryngitis that lasts for more than 3 weeks can be caused by: Lots of talking, yelling, or singing. An injury to the vocal cords. Acid reflux. Allergies. Sinus infection. Mucus draining from the nose down the throat (postnasal drip). Smoking. Drinking too much alcohol. Breathing in chemicals or dust. Having growths on the vocal cords. What increases the risk? Smoking. Drinking too much alcohol. Having allergies. Breathing in fumes at work. What are the signs or symptoms? A change in your voice. It may sound low and hoarse. Loss of voice. Dry cough. Sore throat. Dry throat. Stuffy nose. How is this treated? Treatment depends on what is causing the laryngitis. Usually, treatment includes: Resting your voice. Using medicines to soothe your throat. If your laryngitis is caused by an infection from bacteria, you may need to take antibiotics. If your laryngitis is caused by a growth on your vocal cords, you may need to have a surgery to remove it. Follow these instructions at home: Medicines Take over-the-counter and prescription medicines only as told by your doctor. If you were prescribed an antibiotic medicine, take it as told by your doctor. Do not stop taking it even if you start to feel better. Use throat lozenges or sprays to soothe your throat as told by your doctor. General instructions  Talk as little as  possible. To do this: Avoid whispering. Write instead of talking. Do this until your voice is back to normal. Rinse your mouth (gargle) with a salt water mixture 3-4 times a day or as needed. To make salt water, dissolve -1 tsp (3-6 g) of salt in 1 cup (237 mL) of warm water. Do not swallow this mixture. Drink enough fluid to keep your pee (urine) pale yellow. Breathe in moist air. Use a humidifier if you live in a dry climate. Do not smoke or use any products that contain nicotine or tobacco. If you need help quitting, ask your doctor. Contact a doctor if: You have a fever. Your pain is worse. Your symptoms do not get better in 2 weeks. Get help right away if: You cough up blood. You have trouble swallowing. You have trouble breathing. Summary Laryngitis is inflammation of your vocal cords. This condition causes your voice to sound low and hoarse. Rest your voice by talking as little as possible. Also avoid whispering. Get help right away if you have trouble swallowing or breathing or if you cough up blood. This information is not intended to replace advice given to you by your health care provider. Make sure you discuss any questions you have with your health care provider. Document Revised: 06/10/2020 Document Reviewed: 06/10/2020 Elsevier Patient Education  2024 [employer].

## 2023-06-28 NOTE — Progress Notes (Signed)
 Subjective:    Patient ID: Darren Robinson, male    DOB: 28-Jul-1975, 48 y.o.   MRN: [id number]  HPI  Discussed the use of AI scribe software for clinical note transcription with the patient, who gave verbal consent to proceed.   Darren Robinson is a 48 year old male with a history of sinus surgery and diabetes who presents with hoarseness and allergy symptoms.  He has been experiencing hoarseness for the past week, which began after recovering from a cold a month ago. His allergies worsened following the cold, and despite using Zyrtec, Allegra, nasal sprays, and a neti pot, he continues to experience hoarseness. No headache, ear pain, sore throat, shortness of breath, nausea, vomiting, diarrhea, fever, chills, or body aches. He has a runny nose and nasal congestion with a small amount of clear discharge. He experiences a cough only when phlegm accumulates in his throat, describing the expectorated material as resembling 'scrambled egg'.  He has been taking Zyrtec daily and switched to Allegra when his symptoms worsened about two weeks ago. He also drinks large amounts of water to keep his throat moist, consuming nearly five liters the previous day.  He has a history of sinus surgery, specifically nasal septoplasty with turbinoplasty, which has reduced his previous tendency for sinus infections. He typically experiences bad allergies during this time of year.  He has diabetes and is currently monitoring his blood sugar levels, which are around 120.       Review of Systems   Past Medical History:  Diagnosis Date   Essential hypertension, benign 02/11/2015   Fatigue    Gout    Hx of gout 08/05/2015   Hypertension    Obesity 02/11/2015   OSA (obstructive sleep apnea)    Sleep apnea    Vitamin D deficiency disease     Current Outpatient Medications  Medication Sig Dispense Refill   acetaminophen (TYLENOL) 500 MG tablet Take 1,000-1,500 mg by mouth every 6 (six) hours as needed for  moderate pain or headache.     amLODipine (NORVASC) 10 MG tablet Take 1 tablet (10 mg total) by mouth daily. 90 tablet 3   colchicine 0.6 MG tablet Take 1 tablet (0.6 mg total) by mouth daily as needed (gout). May repeat 2nd dose on Day 1. Take for 7-10 days or until resolved. 30 tablet 2   indomethacin (INDOCIN) 50 MG capsule Take 1 capsule (50 mg total) by mouth 3 (three) times daily as needed. Take with food; may cause GI bleed 21 capsule 0   metFORMIN (GLUCOPHAGE-XR) 500 MG 24 hr tablet Take 2 tablets (1,000 mg total) by mouth daily with breakfast. 180 tablet 3   Multiple Vitamins-Minerals (CENTRUM ULTRA MENS PO) Take by mouth.     sertraline (ZOLOFT) 100 MG tablet Take 1 tablet (100 mg total) by mouth daily. 90 tablet 3   testosterone cypionate (DEPOTESTOSTERONE CYPIONATE) 200 MG/ML injection Inject 0.5 mLs (100 mg total) into the muscle once a week. 10 mL 0   valsartan (DIOVAN) 160 MG tablet Take 1 tablet (160 mg total) by mouth daily. 90 tablet 3   No current facility-administered medications for this visit.    Allergies  Allergen Reactions   Androgel [Testosterone] Other (See Comments)    Testicular atrophy    Family History  Problem Relation Age of Onset   Alcohol abuse Mother    Arthritis Mother    Heart disease Mother    Mental illness Mother    Thyroid disease  Mother    Diabetes Father    Heart attack Father    Kidney disease Father        On Dialysis   Heart disease Father    Hyperlipidemia Father    Thyroid disease Sister    Hyperlipidemia Sister    Hypertension Sister    Lung disease Maternal Grandfather    Heart attack Paternal Grandmother    Cancer Neg Hx    Stroke Neg Hx     Social History   Socioeconomic History   Marital status: Married    Spouse name: Brandi   Number of children: 2   Years of education: 18   Highest education level: Manufacturing engineer (e.g., MA, MS, MEng, MEd, MSW, MBA)  Occupational History   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:  [employer]   Occupation: retired    Comment: Emergency planning/management officer  Tobacco Use   Smoking status: Former    Current packs/day: 0.00    Average packs/day: 1 pack/day for 10.0 years (10.0 ttl pk-yrs)    Types: Cigarettes    Start date: 04/06/1994    Quit date: 04/06/2004    Years since quitting: 19.2   Smokeless tobacco: Current    Types: Snuff, Chew    Last attempt to quit: 2018   Tobacco comments:    1 can day for 15 years  Vaping Use   Vaping status: Never Used  Substance and Sexual Activity   Alcohol use: Yes    Alcohol/week: 10.0 standard drinks of alcohol    Types: 10 Standard drinks or equivalent per week    Comment: Social   Drug use: No   Sexual activity: Yes    Partners: Female  Other Topics Concern   Not on file  Social History Narrative   Not on file   Social Drivers of Health   Financial Resource Strain: Low Risk  (01/07/2018)   Overall Financial Resource Strain (CARDIA)    Difficulty of Paying Living Expenses: Not hard at all  Food Insecurity: No Food Insecurity (01/07/2018)   Hunger Vital Sign    Worried About Running Out of Food in the Last Year: Never true    Ran Out of Food in the Last Year: Never true  Transportation Needs: No Transportation Needs (01/07/2018)   PRAPARE - Administrator, Civil Service (Medical): No    Lack of Transportation (Non-Medical): No  Physical Activity: Inactive (01/07/2018)   Exercise Vital Sign    Days of Exercise per Week: 0 days    Minutes of Exercise per Session: 0 min  Stress: No Stress Concern Present (01/07/2018)   [employer] of Occupational Health - Occupational Stress Questionnaire    Feeling of Stress : Not at all  Social Connections: Moderately Integrated (01/07/2018)   Social Connection and Isolation Panel [NHANES]    Frequency of Communication with Friends and Family: Twice a week    Frequency of Social Gatherings with Friends and Family: Once a week    Attends Religious Services: More than 4 times per year     Active Member of [employer] or Organizations: No    Attends Banker Meetings: Never    Marital Status: Married  Catering manager Violence: Not At Risk (01/07/2018)   Humiliation, Afraid, Rape, and Kick questionnaire    Fear of Current or Ex-Partner: No    Emotionally Abused: No    Physically Abused: No    Sexually Abused: No     Constitutional: Denies fever, malaise,  fatigue, headache or abrupt weight changes.  HEENT: Pt reports runny nose, nasal congestion, hoarseness. Denies eye pain, eye redness, ear pain, ringing in the ears, wax buildup, bloody nose, or sore throat. Respiratory: Pt reports cough. Denies difficulty breathing, shortness of breath.   Cardiovascular: Denies chest pain, chest tightness, palpitations or swelling in the hands or feet.  Gastrointestinal: Denies abdominal pain, bloating, constipation, diarrhea or blood in the stool.   speech or problems with balance and coordination.    No other specific complaints in a complete review of systems (except as listed in HPI above).      Objective:   Physical Exam  BP (!) 144/90 (BP Location: Left Arm, Patient Position: Sitting, Cuff Size: Large)   Pulse 70   Ht 6\' 7"  (2.007 m)   Wt (!) 391 lb 8 oz (177.6 kg)   SpO2 97%   BMI 44.10 kg/m  Wt Readings from Last 3 Encounters:  06/28/23 (!) 391 lb 8 oz (177.6 kg)  05/24/23 (!) 398 lb (180.5 kg)  01/27/23 (!) 410 lb (186 kg)    General: Appears his stated age, obese, in NAD. Skin: Warm, dry and intact.  HEENT: Head: normal shape and size, no sinus tenderness noted; Eyes: sclera white, no icterus, conjunctiva pink, PERRLA and EOMs intact; Nose: mucosa pink and moist, septum midline; Throat/Mouth: Teeth present, mucosa pink and moist, + PND, no exudate, lesions or ulcerations noted.  Neck:  No adenopathy noted. Cardiovascular: Normal rate and rhythm. S1,S2 noted.  No murmur, rubs or gallops noted.  Pulmonary/Chest: Normal effort and positive vesicular breath  sounds. No respiratory distress. No wheezes, rales or ronchi noted.  Musculoskeletal: No difficulty with gait.  Neurological: Alert and oriented.   BMET    Component Value Date/Time   NA 138 01/20/2023 1410   NA 136 12/16/2017 1623   K 4.8 01/20/2023 1410   CL 101 01/20/2023 1410   CO2 29 01/20/2023 1410   GLUCOSE 104 (H) 01/20/2023 1410   BUN 12 01/20/2023 1410   BUN 24 12/16/2017 1623   CREATININE 1.25 01/20/2023 1410   CALCIUM 9.7 01/20/2023 1410   GFRNONAA >60 10/23/2020 1729   GFRNONAA 71 10/19/2019 0856   GFRAA 83 10/19/2019 0856    Lipid Panel     Component Value Date/Time   CHOL 247 (H) 01/20/2023 1410   CHOL 257 (H) 12/16/2017 1623   TRIG 118 01/20/2023 1410   HDL 47 01/20/2023 1410   HDL 44 12/16/2017 1623   CHOLHDL 5.3 (H) 01/20/2023 1410   VLDL 35 (H) 08/10/2016 1035   LDLCALC 175 (H) 01/20/2023 1410    CBC    Component Value Date/Time   WBC 5.4 01/09/2022 0934   RBC 4.95 01/09/2022 0934   HGB 17.6 01/15/2023 0856   HCT 54.0 (H) 01/15/2023 0856   PLT 210 01/09/2022 0934   PLT 171 02/07/2015 1453   MCV 100.0 01/09/2022 0934   MCV 90 02/07/2015 1453   MCH 31.9 01/09/2022 0934   MCHC 31.9 (L) 01/09/2022 0934   RDW 14.3 01/09/2022 0934   RDW 14.1 02/07/2015 1453   LYMPHSABS 2,311 01/09/2022 0934   LYMPHSABS 2.9 02/07/2015 1453   MONOABS 472 08/10/2016 1035   EOSABS 248 01/09/2022 0934   EOSABS 0.3 02/07/2015 1453   BASOSABS 49 01/09/2022 0934   BASOSABS 0.1 02/07/2015 1453    Hgb A1C Lab Results  Component Value Date   HGBA1C 6.9 (A) 05/24/2023  Assessment & Plan:   Assessment and Plan    Laryngitis secondary to allergies Hoarseness and postnasal drip likely due to allergies. No infection signs. Antibiotics not indicated unless symptoms worsen with fever or purulent discharge. Prednisone considered for inflammation and mucus reduction. - Prescribe prednisone 10 mg for 5 days. - Double Allegra dose to twice daily for 3  days. - Monitor blood sugar levels closely due to potential hyperglycemia from prednisone. - Instruct to report worsening symptoms such as fever or purulent nasal discharge.  Diabetes Mellitus Diabetes well-controlled. Prednisone may cause temporary hyperglycemia. Prepared to adjust diet. - Monitor blood sugar levels closely during prednisone treatment. - Adjust diet to manage potential hyperglycemia.

## 2023-07-27 ENCOUNTER — Other Ambulatory Visit: Payer: Self-pay | Admitting: *Deleted

## 2023-07-27 DIAGNOSIS — E291 Testicular hypofunction: Secondary | ICD-10-CM

## 2023-07-28 ENCOUNTER — Other Ambulatory Visit: Payer: Self-pay

## 2023-08-04 ENCOUNTER — Other Ambulatory Visit

## 2023-08-04 DIAGNOSIS — E291 Testicular hypofunction: Secondary | ICD-10-CM

## 2023-08-04 DIAGNOSIS — N529 Male erectile dysfunction, unspecified: Secondary | ICD-10-CM

## 2023-08-05 LAB — TESTOSTERONE: Testosterone: 380 ng/dL (ref 264–916)

## 2023-09-15 ENCOUNTER — Other Ambulatory Visit: Payer: Self-pay | Admitting: Urology

## 2023-11-01 ENCOUNTER — Encounter: Payer: Self-pay | Admitting: Urology

## 2024-01-10 ENCOUNTER — Other Ambulatory Visit: Payer: Self-pay | Admitting: Urology

## 2024-01-24 ENCOUNTER — Ambulatory Visit: Payer: Self-pay | Admitting: Family Medicine

## 2024-01-24 ENCOUNTER — Encounter: Payer: Self-pay | Admitting: Family Medicine

## 2024-01-24 VITALS — BP 134/84 | HR 70 | Ht 79.0 in | Wt 370.0 lb

## 2024-01-24 DIAGNOSIS — F331 Major depressive disorder, recurrent, moderate: Secondary | ICD-10-CM

## 2024-01-24 DIAGNOSIS — E1169 Type 2 diabetes mellitus with other specified complication: Secondary | ICD-10-CM | POA: Diagnosis not present

## 2024-01-24 DIAGNOSIS — Z125 Encounter for screening for malignant neoplasm of prostate: Secondary | ICD-10-CM

## 2024-01-24 DIAGNOSIS — Z8739 Personal history of other diseases of the musculoskeletal system and connective tissue: Secondary | ICD-10-CM

## 2024-01-24 DIAGNOSIS — E291 Testicular hypofunction: Secondary | ICD-10-CM

## 2024-01-24 DIAGNOSIS — I10 Essential (primary) hypertension: Secondary | ICD-10-CM

## 2024-01-24 DIAGNOSIS — Z0000 Encounter for general adult medical examination without abnormal findings: Secondary | ICD-10-CM

## 2024-01-24 DIAGNOSIS — Z6841 Body Mass Index (BMI) 40.0 and over, adult: Secondary | ICD-10-CM

## 2024-01-24 DIAGNOSIS — Z7984 Long term (current) use of oral hypoglycemic drugs: Secondary | ICD-10-CM

## 2024-01-24 MED ORDER — SERTRALINE HCL 100 MG PO TABS
100.0000 mg | ORAL_TABLET | Freq: Every day | ORAL | 3 refills | Status: AC
Start: 2024-01-24 — End: ?

## 2024-01-24 MED ORDER — VALSARTAN 160 MG PO TABS: 160.0000 mg | ORAL_TABLET | Freq: Every day | ORAL | 3 refills | Status: AC

## 2024-01-24 MED ORDER — METFORMIN HCL ER 500 MG PO TB24: 1000.0000 mg | ORAL_TABLET | Freq: Every day | ORAL | 3 refills | Status: AC

## 2024-01-24 MED ORDER — AMLODIPINE BESYLATE 10 MG PO TABS: 10.0000 mg | ORAL_TABLET | Freq: Every day | ORAL | 3 refills | Status: AC
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
## 2024-01-24 NOTE — Patient Instructions (Addendum)
 Thank you for coming to the office today.  Labs on Friday 945   You have been referred for a Coronary Calcium Score Cardiac CT Scan. This is a screening test for patients aged 47-50+ with cardiovascular risk factors or who are healthy but would be interested in Cardiovascular Screening for heart disease. Even if there is a family history of heart disease, this imaging can be useful. Typically it can be done every 5+ years or at a different timeline we agree on  The scan will look at the chest and mainly focus on the heart and identify early signs of calcium build up or blockages within the heart arteries. It is not 100% accurate for identifying blockages or heart disease, but it is useful to help us  predict who may have some early changes or be at risk in the future for a heart attack or cardiovascular problem.  The results are reviewed by a Cardiologist and they will document the results. It should become available on MyChart. Typically the results are divided into percentiles based on other patients of the same demographic and age. So it will compare your risk to others similar to you. If you have a higher score >99 or higher percentile >75%tile, it is recommended to consider Statin cholesterol therapy and or referral to Cardiologist. I will try to help explain your results and if we have questions we can contact the Cardiologist.  You will be contacted for scheduling. Usually it is done at any imaging facility through [facility], [facility] or [facility].  The cost is $99 flat fee total and it does not go through insurance, so no authorization is required.  ------    Please schedule a Follow-up Appointment to: Return if symptoms worsen or fail to improve, for 1 year fasting lab > 1 week later Annual Physical.  If you have any other questions or concerns, please feel free to call the office or send a message through MyChart. You may also schedule an earlier  appointment if necessary.  Additionally, you may be receiving a survey about your experience at our office within a few days to 1 week by e-mail or mail. We value your feedback.  Marsa Officer, DO [facility], [place]

## 2024-01-26 ENCOUNTER — Other Ambulatory Visit: Payer: Self-pay

## 2024-01-26 DIAGNOSIS — Z125 Encounter for screening for malignant neoplasm of prostate: Secondary | ICD-10-CM

## 2024-01-26 DIAGNOSIS — N529 Male erectile dysfunction, unspecified: Secondary | ICD-10-CM

## 2024-01-26 DIAGNOSIS — E291 Testicular hypofunction: Secondary | ICD-10-CM

## 2024-01-27 ENCOUNTER — Other Ambulatory Visit: Payer: Self-pay

## 2024-01-28 ENCOUNTER — Other Ambulatory Visit

## 2024-01-28 DIAGNOSIS — Z8739 Personal history of other diseases of the musculoskeletal system and connective tissue: Secondary | ICD-10-CM

## 2024-01-28 DIAGNOSIS — I10 Essential (primary) hypertension: Secondary | ICD-10-CM

## 2024-01-28 DIAGNOSIS — E291 Testicular hypofunction: Secondary | ICD-10-CM

## 2024-01-28 DIAGNOSIS — F331 Major depressive disorder, recurrent, moderate: Secondary | ICD-10-CM

## 2024-01-28 DIAGNOSIS — Z125 Encounter for screening for malignant neoplasm of prostate: Secondary | ICD-10-CM

## 2024-01-28 DIAGNOSIS — Z0000 Encounter for general adult medical examination without abnormal findings: Secondary | ICD-10-CM

## 2024-01-28 DIAGNOSIS — E1169 Type 2 diabetes mellitus with other specified complication: Secondary | ICD-10-CM

## 2024-01-29 LAB — COMPREHENSIVE METABOLIC PANEL WITH GFR
AG Ratio: 1.8 (calc) (ref 1.0–2.5)
ALT: 46 U/L (ref 9–46)
AST: 26 U/L (ref 10–40)
Albumin: 4.5 g/dL (ref 3.6–5.1)
Alkaline phosphatase (APISO): 70 U/L (ref 36–130)
BUN: 10 mg/dL (ref 7–25)
CO2: 28 mmol/L (ref 20–32)
Calcium: 9.5 mg/dL (ref 8.6–10.3)
Chloride: 102 mmol/L (ref 98–110)
Creat: 1.25 mg/dL (ref 0.60–1.29)
Globulin: 2.5 g/dL (ref 1.9–3.7)
Glucose, Bld: 107 mg/dL — ABNORMAL HIGH (ref 65–99)
Potassium: 4.4 mmol/L (ref 3.5–5.3)
Sodium: 139 mmol/L (ref 135–146)
Total Bilirubin: 0.6 mg/dL (ref 0.2–1.2)
Total Protein: 7 g/dL (ref 6.1–8.1)
eGFR: 71 mL/min/1.73m2 (ref >=60)

## 2024-01-29 LAB — CBC WITH DIFFERENTIAL/PLATELET
Absolute Lymphocytes: 2237 {cells}/uL (ref 850–3900)
Absolute Monocytes: 734 {cells}/uL (ref 200–950)
Basophils Absolute: 102 {cells}/uL (ref 0–200)
Basophils Relative: 1.5 %
Eosinophils Absolute: 360 {cells}/uL (ref 15–500)
Eosinophils Relative: 5.3 %
HCT: 51.7 % — ABNORMAL HIGH (ref 38.5–50.0)
Hemoglobin: 17.5 g/dL — ABNORMAL HIGH (ref 13.2–17.1)
MCH: 31.7 pg (ref 27.0–33.0)
MCHC: 33.8 g/dL (ref 32.0–36.0)
MCV: 93.7 fL (ref 80.0–100.0)
MPV: 10.5 fL (ref 7.5–12.5)
Monocytes Relative: 10.8 %
Neutro Abs: 3366 {cells}/uL (ref 1500–7800)
Neutrophils Relative %: 49.5 %
Platelets: 183 Thousand/uL (ref 140–400)
RBC: 5.52 Million/uL (ref 4.20–5.80)
RDW: 13.4 % (ref 11.0–15.0)
Total Lymphocyte: 32.9 %
WBC: 6.8 Thousand/uL (ref 3.8–10.8)

## 2024-01-29 LAB — PSA: PSA: 0.3 ng/mL (ref <=4.00)

## 2024-01-29 LAB — HEMOGLOBIN A1C
Hgb A1c MFr Bld: 6.2 % — ABNORMAL HIGH (ref <5.7)
Mean Plasma Glucose: 131 mg/dL
eAG (mmol/L): 7.3 mmol/L

## 2024-01-29 LAB — TSH: TSH: 2.47 m[IU]/L (ref 0.40–4.50)

## 2024-01-29 LAB — LIPID PANEL
Cholesterol: 173 mg/dL (ref <200)
HDL: 45 mg/dL (ref >=40)
LDL Cholesterol (Calc): 111 mg/dL — ABNORMAL HIGH
Non-HDL Cholesterol (Calc): 128 mg/dL (ref <130)
Total CHOL/HDL Ratio: 3.8 (calc) (ref <5.0)
Triglycerides: 78 mg/dL (ref <150)

## 2024-01-29 LAB — MICROALBUMIN / CREATININE URINE RATIO
Creatinine, Urine: 124 mg/dL (ref 20–320)
Microalb Creat Ratio: 10 mg/g{creat} (ref <30)
Microalb, Ur: 1.3 mg/dL

## 2024-01-29 LAB — URIC ACID: Uric Acid, Serum: 6.4 mg/dL (ref 4.0–8.0)

## 2024-02-01 ENCOUNTER — Ambulatory Visit: Payer: Self-pay | Admitting: Family Medicine

## 2024-02-01 ENCOUNTER — Ambulatory Visit: Admitting: Urology

## 2024-02-01 ENCOUNTER — Encounter: Payer: Self-pay | Admitting: Urology

## 2024-02-02 ENCOUNTER — Ambulatory Visit
Admission: RE | Admit: 2024-02-02 | Discharge: 2024-02-02 | Disposition: A | Discharge status: Home / Self Care | Payer: Self-pay | Source: Ambulatory Visit | Attending: Family Medicine | Admitting: Family Medicine

## 2024-02-02 DIAGNOSIS — E1169 Type 2 diabetes mellitus with other specified complication: Secondary | ICD-10-CM | POA: Insufficient documentation

## 2024-02-02 DIAGNOSIS — Z6841 Body Mass Index (BMI) 40.0 and over, adult: Secondary | ICD-10-CM | POA: Insufficient documentation

## 2024-02-03 ENCOUNTER — Ambulatory Visit: Payer: Self-pay | Admitting: Urology

## 2024-04-03 ENCOUNTER — Encounter: Payer: Self-pay | Admitting: Family Medicine

## 2024-04-10 DIAGNOSIS — E291 Testicular hypofunction: Secondary | ICD-10-CM

## 2024-04-12 ENCOUNTER — Other Ambulatory Visit

## 2024-04-12 ENCOUNTER — Encounter: Payer: Self-pay | Admitting: Urology

## 2024-04-14 ENCOUNTER — Ambulatory Visit: Admitting: Family Medicine

## 2024-04-17 ENCOUNTER — Telehealth: Admitting: Family Medicine

## 2024-04-17 DIAGNOSIS — F1020 Alcohol dependence, uncomplicated: Secondary | ICD-10-CM

## 2024-04-17 MED ORDER — OXAZEPAM 15 MG PO CAPS: ORAL_CAPSULE | ORAL | 0 refills | Status: AC

## 2024-04-17 MED ORDER — GABAPENTIN 300 MG PO CAPS: ORAL_CAPSULE | ORAL | 0 refills | Status: AC

## 2024-04-17 MED ORDER — NALTREXONE HCL 50 MG PO TABS: 50.0000 mg | ORAL_TABLET | Freq: Every day | ORAL | 5 refills | Status: AC

## 2024-04-17 NOTE — Progress Notes (Unsigned)
 "  Subjective:    Patient ID: Darren Robinson, male    DOB: 1975/12/15, 49 y.o.   MRN: [id number]  Darren Robinson is a 49 y.o. male presenting on 04/17/2024 for No chief complaint on file.   HPI  *** Fatigue, tired, divorce  Health Maintenance: ***     01/24/2024    8:30 AM 06/28/2023    9:09 AM 05/24/2023    2:36 PM  Depression screen PHQ 2/9  Decreased Interest 3 1 2   Down, Depressed, Hopeless 3 0 2  PHQ - 2 Score 6 1 4   Altered sleeping 3 1 2   Tired, decreased energy 3 1 3   Change in appetite 3 0 3  Feeling bad or failure about yourself  1 0 1  Trouble concentrating 1 0 1  Moving slowly or fidgety/restless 0 0 0  Suicidal thoughts 0 0 0  PHQ-9 Score 17  3  14    Difficult doing work/chores Somewhat difficult Not difficult at all Not difficult at all     Data saved with a previous flowsheet row definition       01/24/2024    8:30 AM 06/28/2023    9:09 AM 05/24/2023    2:36 PM 01/20/2023    1:59 PM  GAD 7 : Generalized Anxiety Score  Nervous, Anxious, on Edge 0 0 0 3  Control/stop worrying 0 0 0 3  Worry too much - different things 1 0 0 3  Trouble relaxing 1 1 0 3  Restless 1 0 0 3  Easily annoyed or irritable 1 0 1 3  Afraid - awful might happen 0 0 0 3  Total GAD 7 Score 4 1 1 21   Anxiety Difficulty Not difficult at all Not difficult at all Not difficult at all     Social History[1]  Review of Systems Per HPI unless specifically indicated above     Objective:    There were no vitals taken for this visit.  Wt Readings from Last 3 Encounters:  01/24/24 (!) 370 lb (167.8 kg)  06/28/23 (!) 391 lb 8 oz (177.6 kg)  05/24/23 (!) 398 lb (180.5 kg)    Physical Exam  Results for orders placed or performed in visit on 01/28/24  Uric acid   Collection Time: 01/28/24  9:35 AM  Result Value Ref Range   Uric Acid, Serum 6.4 4.0 - 8.0 mg/dL  Comprehensive metabolic panel with GFR   Collection Time: 01/28/24  9:35 AM  Result Value Ref Range   Glucose, Bld  107 (H) 65 - 99 mg/dL   BUN 10 7 - 25 mg/dL   Creat 8.74 9.39 - 8.70 mg/dL   eGFR 71 > OR = 60 fO/fpw/8.26f7   BUN/Creatinine Ratio SEE NOTE: 6 - 22 (calc)   Sodium 139 135 - 146 mmol/L   Potassium 4.4 3.5 - 5.3 mmol/L   Chloride 102 98 - 110 mmol/L   CO2 28 20 - 32 mmol/L   Calcium 9.5 8.6 - 10.3 mg/dL   Total Protein 7.0 6.1 - 8.1 g/dL   Albumin 4.5 3.6 - 5.1 g/dL   Globulin 2.5 1.9 - 3.7 g/dL (calc)   AG Ratio 1.8 1.0 - 2.5 (calc)   Total Bilirubin 0.6 0.2 - 1.2 mg/dL   Alkaline phosphatase (APISO) 70 36 - 130 U/L   AST 26 10 - 40 U/L   ALT 46 9 - 46 U/L  TSH   Collection Time: 01/28/24  9:35 AM  Result Value Ref  Range   TSH 2.47 0.40 - 4.50 mIU/L  Microalbumin / creatinine urine ratio   Collection Time: 01/28/24  9:35 AM  Result Value Ref Range   Creatinine, Urine 124 20 - 320 mg/dL   Microalb, Ur 1.3 mg/dL   Microalb Creat Ratio 10 <30 mg/g creat  PSA   Collection Time: 01/28/24  9:35 AM  Result Value Ref Range   PSA 0.30 < OR = 4.00 ng/mL  CBC with Differential/Platelet   Collection Time: 01/28/24  9:35 AM  Result Value Ref Range   WBC 6.8 3.8 - 10.8 Thousand/uL   RBC 5.52 4.20 - 5.80 Million/uL   Hemoglobin 17.5 (H) 13.2 - 17.1 g/dL   HCT 48.2 (H) 61.4 - 49.9 %   MCV 93.7 80.0 - 100.0 fL   MCH 31.7 27.0 - 33.0 pg   MCHC 33.8 32.0 - 36.0 g/dL   RDW 86.5 88.9 - 84.9 %   Platelets 183 140 - 400 Thousand/uL   MPV 10.5 7.5 - 12.5 fL   Neutro Abs 3,366 1,500 - 7,800 cells/uL   Absolute Lymphocytes 2,237 850 - 3,900 cells/uL   Absolute Monocytes 734 200 - 950 cells/uL   Eosinophils Absolute 360 15 - 500 cells/uL   Basophils Absolute 102 0 - 200 cells/uL   Neutrophils Relative % 49.5 %   Total Lymphocyte 32.9 %   Monocytes Relative 10.8 %   Eosinophils Relative 5.3 %   Basophils Relative 1.5 %  Hemoglobin A1c   Collection Time: 01/28/24  9:35 AM  Result Value Ref Range   Hgb A1c MFr Bld 6.2 (H) <5.7 %   Mean Plasma Glucose 131 mg/dL   eAG (mmol/L) 7.3 mmol/L   Lipid panel   Collection Time: 01/28/24  9:35 AM  Result Value Ref Range   Cholesterol 173 <200 mg/dL   HDL 45 > OR = 40 mg/dL   Triglycerides 78 <849 mg/dL   LDL Cholesterol (Calc) 111 (H) mg/dL (calc)   Total CHOL/HDL Ratio 3.8 <5.0 (calc)   Non-HDL Cholesterol (Calc) 128 <130 mg/dL (calc)      Assessment & Plan:   Problem List Items Addressed This Visit   None    ***  No orders of the defined types were placed in this encounter.   No orders of the defined types were placed in this encounter.   Follow up plan: No follow-ups on file.  ***Future labs ordered for ***  A total of *** (lvl 3, 4, 5) 15, 25, 40 minutes was spent face-to-face with this patient. Greater than 50% (approximately *** minutes) of this time was spent in counseling and coordination of care with the patient. ***  Marsa Officer, DO [facility] 04/17/2024, 4:09 PM    [1]  Social History Tobacco Use   Smoking status: Former    Current packs/day: 0.00    Average packs/day: 1 pack/day for 10.0 years (10.0 ttl pk-yrs)    Types: Cigarettes    Start date: 04/06/1994    Quit date: 04/06/2004    Years since quitting: 20.0   Smokeless tobacco: Current    Types: Snuff, Chew    Last attempt to quit: 2018   Tobacco comments:    1 can day for 15 years  Vaping Use   Vaping status: Never Used  Substance Use Topics   Alcohol use: Yes    Alcohol/week: 10.0 standard drinks of alcohol    Types: 10 Standard drinks or equivalent per week  Comment: Social   Drug use: No   "

## 2024-04-17 NOTE — Patient Instructions (Addendum)
 Thank you for coming to the office today.  For our Outpatient Alcohol Detox Plan take the following meds:  Gabapentin  (take every day to reduce withdrawal symptoms at baseline)  ?Day 1 - 300 mg every 6 hours ?Day 2 - 300 mg every 8 hours ?Day 3 - 300 mg every 12 hours ?Day 4-7 - 300 mg one dose ----------------------------------------------------------------  Oxazepam  (take ONLY AS NEEDED for symptoms - tremors, elevated heart rate, sweats/temperature changes, agitation)  Day 1-2 - 30 mg every 6 hours as needed Days 3 to 7- 15 mg every 6 hours as needed  Add Naltrexone  50mg  daily for REDUCE CRAVINGS in future for Alcohol. Take it once every day    Vitamins - B1 (Thiamine) and Folic Acid, and B12 - daily dosing - can do a Multivitamin with most of these as well.  If worsening symptoms despite medicine, uncontrolled tremors, fever, chills sweats, confusion, hallucinations, seizure - emergency call 911, may need hospital detox inpatient  PSYCHIATRY / THERAPY-COUSENLING  Self referral for substance program if need  [facility]  Addiction treatment center in [place], [place]   Address: [address], [place], [place] [address]  Phone: [phone]  [employer], available walk-in 9am-4pm M-F [address] [place], [place] [address] Hours: 9am - 4pm (M-F, walk in available) [phone]  Alcoholics Anonymous (AA)  [place] ([place] Web Site: humorvids.co.uk Answering Service: 520-470-6126   Please schedule a Follow-up Appointment to: No follow-ups on file.  If you have any other questions or concerns, please feel free to call the office or send a message through MyChart. You may also schedule an earlier appointment if necessary.  Additionally, you may be receiving a survey about your experience at our office within a few days to 1 week by e-mail or mail. We value your feedback.  Marsa Officer, DO [facility], [place]

## 2024-04-18 ENCOUNTER — Encounter: Payer: Self-pay | Admitting: Urology

## 2024-04-18 ENCOUNTER — Ambulatory Visit: Admitting: Urology

## 2024-04-18 ENCOUNTER — Other Ambulatory Visit: Payer: Self-pay | Admitting: Medical Genetics

## 2024-04-18 VITALS — BP 171/112 | HR 76 | Ht 79.0 in | Wt 385.0 lb

## 2024-04-18 DIAGNOSIS — I10 Essential (primary) hypertension: Secondary | ICD-10-CM | POA: Diagnosis not present

## 2024-04-18 DIAGNOSIS — E291 Testicular hypofunction: Secondary | ICD-10-CM

## 2024-04-18 NOTE — Progress Notes (Signed)
 "  04/18/2024 10:34 AM   Darren Robinson 1975/08/24 [id number]  Referring provider: Edman Marsa PARAS, DO [address] [place],  [place] [address]  Chief Complaint  Patient presents with   Hypogonadism   Urologic history: 1.  Hypogonadism Symptoms significant tiredness/fatigue, depression, decreased libido, ED Testosterone  cypionate 100 mg weekly   HPI: Darren Robinson is a 49 y.o. male presents for annual follow-up.  Recent labs testosterone  08/04/2023 was 380; PSA 01/28/2024 0.3 Testosterone  level and H/H were drawn this morning.  His last injection was 1 week ago Continues with symptom improvement on present dose and has no complaints   PMH: Past Medical History:  Diagnosis Date   Essential hypertension, benign 02/11/2015   Fatigue    Gout    Hx of gout 08/05/2015   Hypertension    Obesity 02/11/2015   OSA (obstructive sleep apnea)    Sleep apnea    Vitamin D deficiency disease     Surgical History: Past Surgical History:  Procedure Laterality Date   APPENDECTOMY     NASAL SEPTOPLASTY W/ TURBINOPLASTY Bilateral 03/23/2017   Procedure: NASAL SEPTOPLASTY WITH TURBINATE REDUCTION;  Surgeon: Herminio Miu, MD;  Location: [facility] ORS;  Service: ENT;  Laterality: Bilateral;    Home Medications:  Allergies as of 04/18/2024       Reactions   Androgel  [testosterone ] Other (See Comments)   Testicular atrophy        Medication List        Accurate as of April 18, 2024 10:34 AM. If you have any questions, ask your nurse or doctor.          acetaminophen  500 MG tablet Commonly known as: TYLENOL  Take 1,000-1,500 mg by mouth every 6 (six) hours as needed for moderate pain or headache.   amLODipine  10 MG tablet Commonly known as: NORVASC  Take 1 tablet (10 mg total) by mouth daily.   CENTRUM ULTRA MENS PO Take by mouth.   colchicine  0.6 MG tablet Take 1 tablet (0.6 mg total) by mouth daily as needed (gout). May repeat 2nd dose on Day 1. Take for 7-10  days or until resolved.   gabapentin  300 MG capsule Commonly known as: NEURONTIN  Day 1 - 300 mg every 6 hours, Day 2 - 300 mg every 8 hours, Day 3 - 300 mg every 12 hours, Day 4-7 - 300 mg one dose   indomethacin  50 MG capsule Commonly known as: INDOCIN  Take 1 capsule (50 mg total) by mouth 3 (three) times daily as needed. Take with food; may cause GI bleed   metFORMIN  500 MG 24 hr tablet Commonly known as: GLUCOPHAGE -XR Take 2 tablets (1,000 mg total) by mouth daily with breakfast.   naltrexone  50 MG tablet Commonly known as: DEPADE Take 1 tablet (50 mg total) by mouth daily.   oxazepam  15 MG capsule Commonly known as: SERAX  For alcohol detox: Only as needed for withdrawal symptoms, Day 1-2: take 2 capsules every 6 hr as needed, Day 3-7: take 1 capsule every 6-8 hours as needed   sertraline  100 MG tablet Commonly known as: ZOLOFT  Take 1 tablet (100 mg total) by mouth daily.   testosterone  cypionate 200 MG/ML injection Commonly known as: DEPOTESTOSTERONE CYPIONATE Inject 0.5 mLs (100 mg total) into the muscle once a week.   valsartan  160 MG tablet Commonly known as: DIOVAN  Take 1 tablet (160 mg total) by mouth daily.        Allergies: Allergies[1]  Family History: Family History  Problem Relation  Age of Onset   Alcohol abuse Mother    Arthritis Mother    Heart disease Mother    Mental illness Mother    Thyroid  disease Mother    Diabetes Father    Heart attack Father    Kidney disease Father        On Dialysis   Heart disease Father    Hyperlipidemia Father    Thyroid  disease Sister    Hyperlipidemia Sister    Hypertension Sister    Lung disease Maternal Grandfather    Heart attack Paternal Grandmother    Cancer Neg Hx    Stroke Neg Hx     Social History:  reports that he quit smoking about 20 years ago. His smoking use included cigarettes. He started smoking about 30 years ago. He has a 10 pack-year smoking history. His smokeless tobacco use includes  snuff and chew. He reports current alcohol use of about 10.0 standard drinks of alcohol per week. He reports that he does not use drugs.   Physical Exam: BP (!) 167/120   Pulse 68   Ht 6' 7 (2.007 m)   Wt (!) 385 lb (174.6 kg)   BMI 43.37 kg/m   Constitutional:  Alert, No acute distress. HEENT: [place] AT Respiratory: Normal respiratory effort, no increased work of breathing. Psychiatric: Normal mood and affect.   Assessment & Plan:    1. Hypogonadism in male Doing well on TRT Will contact with lab results Lab visit 6 months testosterone , H/H Office visit 1 year with same labs; PCP is checking PSA  2.  Hypertension Initial BP elevated 167/120; repeat BP was 171/112 It was recommended he contact Dr. Danya office for follow-up evaluation   Glendia JAYSON Barba, MD  [facility] [address], [address] [place], [place] [address] [phone]     [1]  Allergies Allergen Reactions   Androgel  [Testosterone ] Other (See Comments)    Testicular atrophy   "

## 2024-04-19 LAB — HEMOGLOBIN AND HEMATOCRIT, BLOOD
Hematocrit: 53.7 % — ABNORMAL HIGH (ref 37.5–51.0)
Hemoglobin: 18.3 g/dL — ABNORMAL HIGH (ref 13.0–17.7)

## 2024-04-19 LAB — TESTOSTERONE: Testosterone: 416 ng/dL (ref 264–916)

## 2024-04-20 ENCOUNTER — Ambulatory Visit: Payer: Self-pay | Admitting: Urology

## 2024-05-05 ENCOUNTER — Other Ambulatory Visit: Payer: Self-pay | Admitting: Urology

## 2024-05-10 ENCOUNTER — Other Ambulatory Visit

## 2024-07-24 ENCOUNTER — Ambulatory Visit: Admitting: Family Medicine

## 2024-10-16 ENCOUNTER — Other Ambulatory Visit

## 2025-01-22 ENCOUNTER — Other Ambulatory Visit

## 2025-01-29 ENCOUNTER — Encounter: Admitting: Family Medicine

## 2025-04-11 ENCOUNTER — Other Ambulatory Visit

## 2025-04-18 ENCOUNTER — Ambulatory Visit: Admitting: Urology
